# Patient Record
Sex: Female | Born: 1955 | Race: White | Hispanic: No | Marital: Married | Smoking: Current every day smoker
Health system: Southern US, Community
[De-identification: ages and names within clinical notes are randomized; demographics above are authoritative.]

---

## 2007-03-20 ENCOUNTER — Ambulatory Visit: Payer: Self-pay | Admitting: Cardiology

## 2015-09-22 ENCOUNTER — Encounter (INDEPENDENT_AMBULATORY_CARE_PROVIDER_SITE_OTHER): Payer: Self-pay | Admitting: *Deleted

## 2017-11-17 ENCOUNTER — Ambulatory Visit: Payer: 59 | Admitting: Internal Medicine

## 2017-11-17 ENCOUNTER — Encounter: Payer: Self-pay | Admitting: Internal Medicine

## 2017-11-17 VITALS — BP 124/70 | HR 77 | Ht 65.0 in | Wt 95.6 lb

## 2017-11-17 DIAGNOSIS — F17210 Nicotine dependence, cigarettes, uncomplicated: Secondary | ICD-10-CM | POA: Diagnosis not present

## 2017-11-17 DIAGNOSIS — J449 Chronic obstructive pulmonary disease, unspecified: Secondary | ICD-10-CM | POA: Insufficient documentation

## 2017-11-17 DIAGNOSIS — J9819 Other pulmonary collapse: Secondary | ICD-10-CM | POA: Diagnosis not present

## 2017-11-17 NOTE — Assessment & Plan Note (Signed)
See CT chest 10/09/17  - FOB 11/29/17 scheduled  Explained the pathophysiology of RML syndrome emphasizing it's usually from something benign but may be related to retained mucus plugs or more seriously from endobronchial dz not seen on CT so best way to sort out is FOB with BAL   Discussed in detail all the  indications, usual  risks and alternatives  relative to the benefits with patient who agrees to proceed with bronchoscopy with BAL    Total time devoted to counseling  > 50 % of initial 60 min office visit:  review case with pt/husband, son,  discussion of options/alternatives/ personally creating written customized instructions  in presence of pt  then going over those specific  Instructions directly with the pt including how to use all of the meds but in particular covering each new medication in detail and the difference between the maintenance= "automatic" meds and the prns using an action plan format for the latter (If this problem/symptom => do that organization reading Left to right).  Please see AVS from this visit for a full list of these instructions which I personally wrote for this pt and  are unique to this visit.

## 2017-11-17 NOTE — Progress Notes (Signed)
Subjective:     Patient ID: Theresa Villa, female   DOB: 1956/02/18,     MRN: [id number]  HPI  15 yowf active smoker with dx discoid lupus /fibromyalgia followed x 30 years at [facility] with onset cough May/ June 2018 productive yellow mucus abx/ prednisone rx by Dr Sherril Croon and xray abn > CT Chest      11/17/2017 1st [facility] office visit/ Nayelie Gionfriddo   Chief Complaint  Patient presents with  . Pulmonary Consult    Referred by Dr. Sherril Croon for eval of pulmonary nodules. Pt c/o occ CP "pretty constant" sinc May 2018. The pain radiates under her Left breast   She also c/o DOE with walking short distances when she gets in a hurry. She has a prod cough with clear sputum.    cough x May/ June 2018 then cp under L breast cp dull pain not pleuritic at all / present 24/7  Clear is worse first thing in am x up to hour but mucus is mostly clear now Doe = MMRC3 = can't walk 100 yards even at a slow pace at a flat grade s stopping due to sob but still able to do HT slowly / very little housework, uses hc parking Some better p saba though hfa only about 50% effective at baseline and only using symb 160 1 bid but not first thing in am (uses  saba first)    No obvious day to day or daytime variability or assoc excess/ purulent sputum or mucus plugs or hemoptysis or  chest tightness, subjective wheeze or overt sinus or hb symptoms. No unusual exposure hx or h/o childhood pna/ asthma or knowledge of premature birth.  Sleeping ok flat without nocturnal  or early am exacerbation  of respiratory  c/o's or need for noct saba. Also denies any obvious fluctuation of symptoms with weather or environmental changes or other aggravating or alleviating factors except as outlined above   Current Allergies, Complete Past Medical History, Past Surgical History, Family History, and Social History were reviewed in [employer] record.  ROS  The following are not active complaints unless bolded Hoarseness, sore  throat, dysphagia, dental problems, itching, sneezing,  nasal congestion or discharge of excess mucus or purulent secretions, ear ache,   fever, chills, sweats, unintended wt loss or wt gain, classically pleuritic or exertional cp,  orthopnea pnd or leg swelling, presyncope, palpitations, abdominal pain, anorexia, nausea, vomiting, diarrhea  or change in bowel habits or change in bladder habits, change in stools or change in urine, dysuria, hematuria,  rash, arthralgias, visual complaints, headache, numbness, weakness or ataxia or problems with walking or coordination,  change in mood/affect or memory.        Current Meds  Medication Sig  . albuterol (PROAIR HFA) 108 (90 Base) MCG/ACT inhaler Inhale 1 puff into the lungs every 6 (six) hours as needed for wheezing or shortness of breath.  . ALPRAZolam (XANAX) 0.5 MG tablet Take 0.5 mg by mouth daily.  . Ascorbic Acid (VITAMIN C) 1000 MG tablet Take 1,000 mg by mouth daily.  Marland Kitchen atenolol (TENORMIN) 25 MG tablet Take 25 mg by mouth daily.  . budesonide-formoterol (SYMBICORT) 160-4.5 MCG/ACT inhaler Inhale 1 puff into the lungs 2 (two) times daily.  . Clobetasol Propionate (CORMAX EX) Apply topically.  Marland Kitchen denosumab (PROLIA) 60 MG/ML SOLN injection Inject 60 mg into the skin every 6 (six) months. Administer in upper arm, thigh, or abdomen  . ELDERBERRY PO Take by mouth daily.  Marland Kitchen  halobetasol (ULTRAVATE) 0.05 % cream Apply topically as needed.  . hydroxychloroquine (PLAQUENIL) 200 MG tablet Take 200 mg by mouth daily.  . Multiple Vitamins-Minerals (MULTIVITAMIN WITH MINERALS) tablet Take 1 tablet by mouth daily.  Marland Kitchen. tiZANidine (ZANAFLEX) 4 MG capsule Take 8 mg by mouth at bedtime.  . traMADol (ULTRAM) 50 MG tablet Take by mouth every 6 (six) hours as needed.          Review of Systems     Objective:   Physical Exam  amb anxious wf nad  Wt Readings from Last 3 Encounters:  11/17/17 95 lb 9.6 oz (43.4 kg)     Vital signs reviewed - Note on  arrival 02 sats  95% on RA       HEENT: nl dentition, turbinates bilaterally, and oropharynx. Nl external ear canals without cough reflex   NECK :  without JVD/Nodes/TM/ nl carotid upstrokes bilaterally   LUNGS: no acc muscle use,  Nl contour chest  with mid exp wheeze bilaterally but not cough on insp/exp    CV:  RRR  no s3 or murmur or increase in P2, and no edema   ABD:  soft and nontender with nl inspiratory excursion in the supine position. No bruits or organomegaly appreciated, bowel sounds nl  MS:  Nl gait/ ext warm without deformities, calf tenderness, cyanosis or clubbing No obvious joint restrictions   SKIN: warm and dry without lesions    NEURO:  alert, approp, nl sensorium with  no motor or cerebellar deficits apparent.     I personally reviewed images and agree with radiology impression as follows:   CT chest 10/09/17   MPN's all less than 6 mm/ RML atx s mass         Assessment:

## 2017-11-17 NOTE — Assessment & Plan Note (Signed)
>   3 min discussion  I emphasized that although we never turn away smokers from the pulmonary clinic, we do ask that they understand that the recommendations that we make  won't work nearly as well in the presence of continued cigarette exposure and there is no antedote to offer   In fact, we may very well  reach a point where we can't promise to help the patient if he/she can't quit smoking. (We can and will promise to try to help, we just can't promise what we recommend will really work)

## 2017-11-17 NOTE — Assessment & Plan Note (Signed)
Spirometry 11/17/2017  FEV1 1.01 (39%)  Ratio 44 p saba and symb though hfa poor @ baseline - 11/17/2017  After extensive coaching inhaler device  effectiveness =    75% (very short Ti) > rec increase symb 160 to 2 bid      I reviewed the Fletcher curve with the patient that basically indicates  if you quit smoking when your best day FEV1 is still preserved (as is  Only relateively true here) it is unlikely you will progress to endstage  disease and informed the patient there was  no medication on the market that has proven to alter the curve/ its downward trajectory  or the likelihood of progression of their disease(unlike other chronic medical conditions such as atheroclerosis where we do think we can change the natural hx with risk reducing meds)    Therefore stopping smoking and maintaining abstinence are  the most important aspects of care, not choice of inhalers or for that matter, doctors.   Treatment other than smoking cessation  is entirely directed by severity of symptoms and focused also on reducing exacerbations, not attempting to change the natural history of the disease.

## 2017-11-17 NOTE — Patient Instructions (Addendum)
Come to outpatient registration at [facility] (behind the ER) at 715 am Wednesday Jan 16th with nothing to eat or drink after midnight Turesday for Bronchoscopy   symbicort 160 Take 2 puffs first thing in am and then another 2 puffs about 12 hours later.   Only use your albuterol as a rescue medication to be used if you can't catch your breath by resting or doing a relaxed purse lip breathing pattern.  - The less you use it, the better it will work when you need it. - Ok to use up to 2 puffs  every 4 hours if you must but call for immediate appointment if use goes up over your usual need - Don't leave home without it !!  (think of it like the spare tire for your car)    Work on inhaler technique:  relax and gently blow all the way out then take a nice smooth deep breath back in, triggering the inhaler at same time you start breathing in.  Hold for up to 5 seconds if you can. Blow out thru nose. Rinse and gargle with water when done

## 2017-11-22 ENCOUNTER — Telehealth: Payer: Self-pay | Admitting: Internal Medicine

## 2017-11-22 NOTE — Telephone Encounter (Signed)
Called and spoke with pt.  Pt states she had Prolia injection on Monday. Pt states she developed swollen throat and headache.  Pt states these symptoms did subsided with benadryl. Pt is wanting to know if it is okay to have scheduled bronch on 11/29/17 after having Prolia injection.    MW please advise. Thanks

## 2017-11-22 NOTE — Telephone Encounter (Signed)
Spoke with patient. She is aware of MWs recs. Nothing else needed at time of call.  

## 2017-11-22 NOTE — Telephone Encounter (Signed)
Yes, no problem - glad she's feeling better

## 2017-11-27 ENCOUNTER — Telehealth: Payer: Self-pay | Admitting: Internal Medicine

## 2017-11-27 NOTE — Telephone Encounter (Signed)
MW, pt cancelled Bronchoscopy due to illness on 11/29/2017. I called pt to get more details. She states she has sinus issues, coughing, congestion, and headache. She does plan to reschedule this.    3. Nyoka CowdenWert, Michael B, MD (Physician) at 11/17/2017 3:36 PM - Signed    Come to outpatient registration at [facility] (behind the ER) at 715 am Wednesday Jan 16th with nothing to eat or drink after midnight Turesday for Bronchoscopy   symbicort 160 Take 2 puffs first thing in am and then another 2 puffs about 12 hours later.   Only use your albuterol as a rescue medication to be used if you can't catch your breath by resting or doing a relaxed purse lip breathing pattern.  - The less you use it, the better it will work when you need it. - Ok to use up to 2 puffs  every 4 hours if you must but call for immediate appointment if use goes up over your usual need - Don't leave home without it !!  (think of it like the spare tire for your car)    Work on inhaler technique:  relax and gently blow all the way out then take a nice smooth deep breath back in, triggering the inhaler at same time you start breathing in.  Hold for up to 5 seconds if you can. Blow out thru nose. Rinse and gargle with water when done

## 2017-11-27 NOTE — Telephone Encounter (Signed)
Spoke with pt, aware of recs.  [facility] with [facility] to make aware.  Nothing further needed.

## 2017-11-27 NOTE — Telephone Encounter (Signed)
That's fine with me - let her know if not better in a week or so we should see her here as may have something to do with the problem we've been seeing on cxr   Also notify bronch lab at [phone]

## 2017-11-29 ENCOUNTER — Encounter (HOSPITAL_COMMUNITY): Payer: 59

## 2017-11-29 ENCOUNTER — Ambulatory Visit (HOSPITAL_COMMUNITY): Admission: RE | Admit: 2017-11-29 | Payer: 59 | Source: Ambulatory Visit | Admitting: Internal Medicine

## 2017-11-29 ENCOUNTER — Encounter (HOSPITAL_COMMUNITY): Admission: RE | Payer: Self-pay | Source: Ambulatory Visit

## 2017-11-29 SURGERY — VIDEO BRONCHOSCOPY WITHOUT FLUORO
Anesthesia: Moderate Sedation | Laterality: Bilateral

## 2018-07-04 ENCOUNTER — Encounter (INDEPENDENT_AMBULATORY_CARE_PROVIDER_SITE_OTHER): Payer: Self-pay | Admitting: *Deleted

## 2020-05-12 ENCOUNTER — Other Ambulatory Visit: Payer: Self-pay

## 2020-05-12 ENCOUNTER — Encounter: Payer: Self-pay | Admitting: Internal Medicine

## 2020-05-12 ENCOUNTER — Ambulatory Visit (INDEPENDENT_AMBULATORY_CARE_PROVIDER_SITE_OTHER): Payer: Medicare Other | Admitting: Internal Medicine

## 2020-05-12 ENCOUNTER — Other Ambulatory Visit (HOSPITAL_COMMUNITY)
Admission: RE | Admit: 2020-05-12 | Discharge: 2020-05-12 | Disposition: A | Discharge status: Home / Self Care | Payer: Medicare Other | Source: Ambulatory Visit | Attending: Internal Medicine | Admitting: Internal Medicine

## 2020-05-12 ENCOUNTER — Ambulatory Visit (HOSPITAL_COMMUNITY)
Admission: RE | Admit: 2020-05-12 | Discharge: 2020-05-12 | Disposition: A | Discharge status: Home / Self Care | Payer: Medicare Other | Source: Ambulatory Visit | Attending: Internal Medicine | Admitting: Internal Medicine

## 2020-05-12 DIAGNOSIS — J449 Chronic obstructive pulmonary disease, unspecified: Secondary | ICD-10-CM | POA: Diagnosis not present

## 2020-05-12 DIAGNOSIS — J984 Other disorders of lung: Secondary | ICD-10-CM

## 2020-05-12 DIAGNOSIS — F17210 Nicotine dependence, cigarettes, uncomplicated: Secondary | ICD-10-CM | POA: Diagnosis not present

## 2020-05-12 LAB — CBC WITH DIFFERENTIAL/PLATELET
Abs Immature Granulocytes: 0.04 10*3/uL (ref 0.00–0.07)
Basophils Absolute: 0.1 10*3/uL (ref 0.0–0.1)
Basophils Relative: 1 %
Eosinophils Absolute: 0.2 10*3/uL (ref 0.0–0.5)
Eosinophils Relative: 2 %
HCT: 43.1 % (ref 36.0–46.0)
Hemoglobin: 13.4 g/dL (ref 12.0–15.0)
Immature Granulocytes: 0 %
Lymphocytes Relative: 13 %
Lymphs Abs: 1.5 10*3/uL (ref 0.7–4.0)
MCH: 27.3 pg (ref 26.0–34.0)
MCHC: 31.1 g/dL (ref 30.0–36.0)
MCV: 88 fL (ref 80.0–100.0)
Monocytes Absolute: 0.6 10*3/uL (ref 0.1–1.0)
Monocytes Relative: 5 %
Neutro Abs: 9.1 10*3/uL — ABNORMAL HIGH (ref 1.7–7.7)
Neutrophils Relative %: 79 %
Platelets: 425 10*3/uL — ABNORMAL HIGH (ref 150–400)
RBC: 4.9 MIL/uL (ref 3.87–5.11)
RDW: 16.2 % — ABNORMAL HIGH (ref 11.5–15.5)
WBC: 11.4 10*3/uL — ABNORMAL HIGH (ref 4.0–10.5)
nRBC: 0.2 % (ref 0.0–0.2)

## 2020-05-12 LAB — SEDIMENTATION RATE: Sed Rate: 45 mm/hr — ABNORMAL HIGH (ref 0–22)

## 2020-05-12 MED ORDER — AMOXICILLIN-POT CLAVULANATE 875-125 MG PO TABS
1.0000 | ORAL_TABLET | Freq: Two times a day (BID) | ORAL | 0 refills | Status: AC
Start: 2020-05-12 — End: 2020-05-22

## 2020-05-12 NOTE — Assessment & Plan Note (Signed)
Counseled re importance of smoking cessation but did not meet time criteria for separate billing     Medical decision making was a moderate level of complexity in this case because of  two chronic conditions /diagnoses and one new problem  requiring extra time for  H and P, chart review, counseling,  and generating customized AVS unique to this office visit and charting.   Each maintenance medication was reviewed in detail including emphasizing most importantly the difference between maintenance and prns and under what circumstances the prns are to be triggered using an action plan format where appropriate. Please see avs for details which were reviewed in writing by both me and my nurse and patient given a written copy highlighted where appropriate with yellow highlighter for the patient's continued care at home along with an updated version of their medications.  Patient was asked to maintain medication reconciliation by comparing this list to the actual medications being used at home and to contact this office right away if there is a conflict or discrepancy.

## 2020-05-12 NOTE — Patient Instructions (Addendum)
Plan A = Automatic = Always=    symbicort 160 Take 2 puffs first thing in am and then another 2 puffs about 12 hours later.   Work on inhaler technique:  relax and gently blow all the way out then take a nice smooth deep breath back in, triggering the inhaler at same time you start breathing in.  Hold for up to 5 seconds if you can. Blow out thru nose. Rinse and gargle with water when done    Plan B = Backup (to supplement plan A, not to replace it) Only use your albuterol inhaler as a rescue medication to be used if you can't catch your breath by resting or doing a relaxed purse lip breathing pattern.  - The less you use it, the better it will work when you need it. - Ok to use the inhaler up to 2 puffs  every 4 hours if you must but call for appointment if use goes up over your usual need - Don't leave home without it !!  (think of it like the spare tire for your car)   Plan C = Crisis (instead of Plan B but only if Plan B stops working) - only use your albuterol nebulizer if you first try Plan B and it fails to help > ok to use the nebulizer up to every 4 hours but if start needing it regularly call for immediate appointment  Augmentin 875 mg take one pill twice daily  X 10 days - take at breakfast and supper with large glass of water.  It would help reduce the usual side effects (diarrhea and yeast infections) if you ate cultured yogurt at lunch or a probiotic.  Please remember to go to the lab and x-ray department   for your tests - we will call you with the results when they are available.     Please schedule a follow up office visit in 2  weeks, sooner if needed

## 2020-05-12 NOTE — Assessment & Plan Note (Addendum)
New dx 04/18/20 post segment   - 05/12/2020   Augmentin 875 mg take one pill twice daily  X 10 days  - Quant Gold TB 05/12/2020  - Blasto/ histo/ crypto serologies 05/12/2020   ddx is lung abscess vs lung ca vs necrotizing cancer immitating lung abscess and I favor the latter esp with localized rhonchi on the L but she really hasn't been treated yet for this type of lung infection and is a very poor candidate at this point for any intervention or rx so will rx as infection x 10 day then bring back in 2 weeks and consider FOB at that point depending on progress  Discussed in detail all the  indications, usual  risks and alternatives  relative to the benefits with patient who agrees to proceed with Rx as outlined.

## 2020-05-12 NOTE — Assessment & Plan Note (Signed)
Active smoker Spirometry 11/17/2017  FEV1 1.01 (39%)  Ratio 44 p saba and symb though hfa poor @ baseline - 11/17/2017  After extensive coaching inhaler device  effectiveness =    75% (very short Ti) > rec increase symb 160 to 2 bid    AB on symbicort 160 2bid and prn saba   I spent extra time with pt today reviewing appropriate use of albuterol for prn use on exertion with the following points: 1) saba is for relief of sob that does not improve by walking a slower pace or resting but rather if the pt does not improve after trying this first. 2) If the pt is convinced, as many are, that saba helps recover from activity faster then it's easy to tell if this is the case by re-challenging : ie stop, take the inhaler, then p 5 minutes try the exact same activity (intensity of workload) that just caused the symptoms and see if they are substantially diminished or not after saba 3) if there is an activity that reproducibly causes the symptoms, try the saba 15 min before the activity on alternate days   If in fact the saba really does help, then fine to continue to use it prn but advised may need to look closer at the maintenance regimen being used to achieve better control of airways disease with exertion.

## 2020-05-12 NOTE — Progress Notes (Signed)
Subjective:     Patient ID: Theresa Villa, female   DOB: 08-30-1956,     MRN: [id number]    Brief patient profile:  3 yowf active smoker with dx discoid lupus /fibromyalgia followed x 30 years at [facility] with onset cough May/ June 2018 productive yellow mucus abx/ prednisone rx by Dr Theresa Villa and xray abn > CT Chest      11/17/2017 1st Theresa Villa/ Theresa Villa   Chief Complaint  Patient presents with  . Pulmonary Consult    Referred by Dr. Sherril Villa for eval of pulmonary nodules. Pt c/o occ CP "pretty constant" sinc May 2018. The pain radiates under her Left breast   She also c/o DOE with walking short distances when she gets in a hurry. She has a prod cough with clear sputum.    cough x May/ June 2018 then cp under L breast cp dull pain not pleuritic at all / present 24/7  Clear is worse first thing in am x up to hour but mucus is mostly clear now Doe = MMRC3 = can't walk 100 yards even at a slow pace at a flat grade s stopping due to sob but still able to do HT slowly / very little housework, uses hc parking Some better p saba though hfa only about 50% effective at baseline and only using symb 160 1 bid but not first thing in am (uses  saba first)  rec Symbicort 160 Take 2 puffs first thing in am and then another 2 puffs about 12 hours later.  Only use your albuterol as a rescue medication  Work on inhaler technique RML syndrome > rec fob / pt canceled    05/12/2020  Pulmonary consultation  Re  Lung mass/ prednisone 5 mg one  Chief Complaint  Patient presents with  . Follow-up    Last seen in 2019 and referred back Dr Dr Theresa Villa for eval of abnormal cxr and ct chest.   w/in 24 h of moderna March 21st 2021  But  not treated until end of May = 04/08/20 with  5 days of zmax  and prednisone x 7 days then continued on prednisone daily  Dyspnea:  Walking slow pace anywhere she wants to go  Cough: yellow mucus  Sleeping: wakes up coughing nightly  SABA use: symbicort 2 bid/ albuterol 02:  none    No obvious day to day or daytime variability or assoc excess/ purulent sputum or mucus plugs or hemoptysis or cp or chest tightness, subjective wheeze or overt sinus or hb symptoms.     Also denies any obvious fluctuation of symptoms with weather or environmental changes or other aggravating or alleviating factors except as outlined above   No unusual exposure hx or h/o childhood pna/ asthma or knowledge of premature birth.  Current Allergies, Complete Past Medical History, Past Surgical History, Family History, and Social History were reviewed in [employer] record.  ROS  The following are not active complaints unless bolded Hoarseness, sore throat, dysphagia, dental problems, itching, sneezing,  nasal congestion or discharge of excess mucus or purulent secretions, ear ache,   fever, chills, sweats, unintended wt loss or wt gain, classically pleuritic or exertional cp,  orthopnea pnd or arm/hand swelling  or leg swelling, presyncope, palpitations, abdominal pain, anorexia, nausea, vomiting, diarrhea  or change in bowel habits or change in bladder habits, change in stools or change in urine, dysuria, hematuria,  rash, arthralgias, visual complaints, headache, numbness, weakness or ataxia or problems  with walking or coordination,  change in mood or  memory.        Current Meds  Medication Sig  . albuterol (PROAIR HFA) 108 (90 Base) MCG/ACT inhaler Inhale 1 puff into the lungs every 6 (six) hours as needed for wheezing or shortness of breath.  . ALPRAZolam (XANAX) 0.25 MG tablet Take 0.25 mg by mouth daily.   Marland Kitchen atenolol (TENORMIN) 25 MG tablet Take 25 mg by mouth daily.  Marland Kitchen atorvastatin (LIPITOR) 40 MG tablet Take 40 mg by mouth daily.  . budesonide-formoterol (SYMBICORT) 160-4.5 MCG/ACT inhaler Inhale 1 puff into the lungs 2 (two) times daily.  . clobetasol (TEMOVATE) 0.05 % external solution Apply 1 application topically daily as needed (for lupus).  Marland Kitchen denosumab  (PROLIA) 60 MG/ML SOLN injection Inject 60 mg into the skin every 6 (six) months. Administer in upper arm, thigh, or abdomen  . ELDERBERRY PO Take 1 capsule by mouth daily.   . halobetasol (ULTRAVATE) 0.05 % cream Apply 1 application topically 2 (two) times daily as needed (for lupus).   . hydroxychloroquine (PLAQUENIL) 200 MG tablet Take 200 mg by mouth daily.  Marland Kitchen ibuprofen (ADVIL,MOTRIN) 200 MG tablet Take 200 mg by mouth every 6 (six) hours as needed for headache or moderate pain.  . Multiple Vitamins-Minerals (MULTIVITAMIN WITH MINERALS) tablet Take 1 tablet by mouth daily.  . predniSONE (DELTASONE) 5 MG tablet Take 5 mg by mouth daily with breakfast.  . tiZANidine (ZANAFLEX) 4 MG capsule Take 8 mg by mouth at bedtime.  . traMADol (ULTRAM) 50 MG tablet Take 50 mg by mouth 3 (three) times daily.   . vitamin C (ASCORBIC ACID) 500 MG tablet Take 500 mg by mouth daily.                   Objective:   Physical Exam  amb thin wf appears chronically ill    Wt Readings from Last 3 Encounters:  05/12/20 92 lb (41.7 kg)  11/17/17 95 lb 9.6 oz (43.4 kg)     Vital signs reviewed - Note on arrival 05/12/2020  02 sats  94% on RA       Top dentures/ lower teeth  HEENT : pt wearing mask not removed for exam due to covid -19 concerns.    NECK :  without JVD/Nodes/TM/ nl carotid upstrokes bilaterally   LUNGS: no acc muscle use,  Mod barrel  contour chest wall with bilateral  Distant bs with localized rhonchi LUL  and  without cough on insp or exp maneuvers and mod  Hyperresonant  to  percussion bilaterally     CV:  RRR  no s3 or murmur or increase in P2, and no edema   ABD:  soft and nontender with pos mid insp Hoover's  in the supine position. No bruits or organomegaly appreciated, bowel sounds nl  MS:     ext warm without deformities, calf tenderness, cyanosis or clubbing No obvious joint restrictions   SKIN: warm and dry without lesions    NEURO:  alert, approp, nl sensorium with   no motor or cerebellar deficits apparent.         CXR PA and Lateral:   05/12/2020 :    I personally reviewed images and agree with radiology impression as follows:   No change in a cavitary mass like opacity in the left upper lobe worrisome for carcinoma.  Aortic Atherosclerosis (ICD10-I70.0) and Emphysema (ICD10-J43.9).     Lab Results  Component Value Date  WBC 11.4 (H) 05/12/2020   HGB 13.4 05/12/2020   HCT 43.1 05/12/2020   MCV 88.0 05/12/2020   PLT 425 (H) 05/12/2020       EOS                                                                0.2                                    05/12/2020      Lab Results  Component Value Date   ESRSEDRATE 45 (H) 05/12/2020    Labs ordered 05/12/2020  :  [facility]ters TB/ crypto/histo/blasto serologies    Assessment:

## 2020-05-13 ENCOUNTER — Other Ambulatory Visit: Payer: Self-pay | Admitting: Internal Medicine

## 2020-05-13 DIAGNOSIS — J984 Other disorders of lung: Secondary | ICD-10-CM

## 2020-05-13 LAB — HISTONE ANTIBODIES, IGG, BLOOD: DNA-Histone: 0.9 Units (ref 0.0–0.9)

## 2020-05-13 NOTE — Progress Notes (Signed)
Spoke with pt and notified of results per Dr. Sherene Sires. Pt verbalized understanding and denied any questions. Order for future cxr placed.

## 2020-05-14 LAB — QUANTIFERON-TB GOLD PLUS (RQFGPL)
QuantiFERON Mitogen Value: 1.61 IU/mL
QuantiFERON Nil Value: 0.1 IU/mL
QuantiFERON TB1 Ag Value: 0.09 IU/mL
QuantiFERON TB2 Ag Value: 0.1 IU/mL

## 2020-05-14 LAB — QUANTIFERON-TB GOLD PLUS: QuantiFERON-TB Gold Plus: NEGATIVE

## 2020-05-15 LAB — BLASTOMYCES ANTIGEN: Blastomyces Antigen: NOT DETECTED ng/mL

## 2020-05-25 ENCOUNTER — Telehealth: Payer: Self-pay | Admitting: Internal Medicine

## 2020-05-25 NOTE — Telephone Encounter (Signed)
Per pt's last cxr, MW wants pt to have a repeat cxr at next OV. Called and spoke with pt letting her know this info and she verbalized understanding. Nothing further needed.

## 2020-05-25 NOTE — Telephone Encounter (Signed)
There isn't an order for CT.  Will route to triage to see if order needs to be placed.

## 2020-06-03 ENCOUNTER — Other Ambulatory Visit: Payer: Self-pay

## 2020-06-03 ENCOUNTER — Encounter: Payer: Self-pay | Admitting: Internal Medicine

## 2020-06-03 ENCOUNTER — Ambulatory Visit (INDEPENDENT_AMBULATORY_CARE_PROVIDER_SITE_OTHER): Payer: Medicare Other | Admitting: Internal Medicine

## 2020-06-03 ENCOUNTER — Ambulatory Visit (HOSPITAL_COMMUNITY)
Admission: RE | Admit: 2020-06-03 | Discharge: 2020-06-03 | Disposition: A | Discharge status: Home / Self Care | Payer: Medicare Other | Source: Ambulatory Visit | Attending: Internal Medicine | Admitting: Internal Medicine

## 2020-06-03 DIAGNOSIS — J984 Other disorders of lung: Secondary | ICD-10-CM

## 2020-06-03 DIAGNOSIS — F17210 Nicotine dependence, cigarettes, uncomplicated: Secondary | ICD-10-CM

## 2020-06-03 DIAGNOSIS — J449 Chronic obstructive pulmonary disease, unspecified: Secondary | ICD-10-CM | POA: Diagnosis not present

## 2020-06-03 NOTE — Patient Instructions (Addendum)
The key is to stop smoking completely before smoking completely stops you!  Please remember to go to the  x-ray department at [facility]  for your tests - we will call you with the results when they are available    Please schedule a follow up visit in 3 months but call sooner if needed  > change to f/u cxr in 6 weeks

## 2020-06-03 NOTE — Progress Notes (Signed)
Subjective:     Patient ID: Theresa Villa, female   DOB: Apr 13, 1956,     MRN: [id number]    Brief patient profile:  89 yowf active smoker with dx discoid lupus /fibromyalgia followed x 30 years at [facility] with onset cough May/ June 2018 productive yellow mucus abx/ prednisone rx by Dr Sherril Croon and xray abn > CT Chest      11/17/2017 1st [facility] office visit/ Nori Poland   Chief Complaint  Patient presents with   Pulmonary Consult    Referred by Dr. Sherril Croon for eval of pulmonary nodules. Pt c/o occ CP "pretty constant" sinc May 2018. The pain radiates under her Left breast   She also c/o DOE with walking short distances when she gets in a hurry. She has a prod cough with clear sputum.    cough x May/ June 2018 then cp under L breast cp dull pain not pleuritic at all / present 24/7  Clear is worse first thing in am x up to hour but mucus is mostly clear now Doe = MMRC3 = can't walk 100 yards even at a slow pace at a flat grade s stopping due to sob but still able to do HT slowly / very little housework, uses hc parking Some better p saba though hfa only about 50% effective at baseline and only using symb 160 1 bid but not first thing in am (uses  saba first)  rec Symbicort 160 Take 2 puffs first thing in am and then another 2 puffs about 12 hours later.  Only use your albuterol as a rescue medication  Work on inhaler technique RML syndrome > rec fob / pt canceled    05/12/2020  Pulmonary consultation  Re  Lung mass/ prednisone 5 mg one  Chief Complaint  Patient presents with   Follow-up    Last seen in 2019 and referred back Dr Dr Lissa Hoard for eval of abnormal cxr and ct chest.   w/in 24 h of moderna March 21st 2021  But  not treated until end of May = 04/08/20 with  5 days of zmax  and prednisone x 7 days then continued on prednisone daily  Dyspnea:  Walking slow pace anywhere she wants to go  Cough: yellow mucus  Sleeping: wakes up coughing nightly  SABA use: symbicort 2 bid/ albuterol 02:  none  rec  Plan A = Automatic = Always=    symbicort 160 Take 2 puffs first thing in am and then another 2 puffs about 12 hours later.  Work on inhaler technique:  Plan B = Backup (to supplement plan A, not to replace it) Only use your albuterol inhaler as a rescue medication  Plan C = Crisis (instead of Plan B but only if Plan B stops working) - only use your albuterol nebulizer if you first try Plan B and it fails to help > ok to use the nebulizer up to every 4 hours   Augmentin 875 mg take one pill twice daily  X 10 days - take at breakfast and supper with large glass of water.  Please remember to go to the lab and x-ray department   for your tests - we will call you with the results when they are available   06/03/2020  f/u ov/[place] office/Macoy Rodwell re: ? Lung abscess  Chief Complaint  Patient presents with   Follow-up    Patient states that she feels much better since last visit. Productive cough with clear/yellow mucus. Augmentin helped a lot.  Dyspnea:  Not limited by breathing from desired activities   Cough: green yellow > white  Sleeping: no noct cough  SABA use: still using couple times a day  on symb 160 2bid / never neb  02: none    No obvious day to day or daytime variability or assoc excess/ purulent sputum or mucus plugs or hemoptysis or cp or chest tightness, subjective wheeze or overt sinus or hb symptoms.   Sleeping without nocturnal  or early am exacerbation  of respiratory  c/o's or need for noct saba. Also denies any obvious fluctuation of symptoms with weather or environmental changes or other aggravating or alleviating factors except as outlined above   No unusual exposure hx or h/o childhood pna/ asthma or knowledge of premature birth.  Current Allergies, Complete Past Medical History, Past Surgical History, Family History, and Social History were reviewed in [employer] record.  ROS  The following are not active complaints unless  bolded Hoarseness, sore throat, dysphagia, dental problems, itching, sneezing,  nasal congestion or discharge of excess mucus or purulent secretions, ear ache,   fever, chills, sweats, unintended wt loss or wt gain, classically pleuritic or exertional cp,  orthopnea pnd or arm/hand swelling  or leg swelling, presyncope, palpitations, abdominal pain, anorexia, nausea, vomiting, diarrhea  or change in bowel habits or change in bladder habits, change in stools or change in urine, dysuria, hematuria,  rash, arthralgias, visual complaints, headache, numbness, weakness or ataxia or problems with walking or coordination,  change in mood or  memory.        Current Meds  Medication Sig   albuterol (PROAIR HFA) 108 (90 Base) MCG/ACT inhaler Inhale 1 puff into the lungs every 6 (six) hours as needed for wheezing or shortness of breath.   ALPRAZolam (XANAX) 0.25 MG tablet Take 0.25 mg by mouth daily.    atenolol (TENORMIN) 25 MG tablet Take 25 mg by mouth daily.   atorvastatin (LIPITOR) 40 MG tablet Take 40 mg by mouth daily.   budesonide-formoterol (SYMBICORT) 160-4.5 MCG/ACT inhaler Inhale 1 puff into the lungs 2 (two) times daily.   clobetasol (TEMOVATE) 0.05 % external solution Apply 1 application topically daily as needed (for lupus).   denosumab (PROLIA) 60 MG/ML SOLN injection Inject 60 mg into the skin every 6 (six) months. Administer in upper arm, thigh, or abdomen   ELDERBERRY PO Take 1 capsule by mouth daily.    halobetasol (ULTRAVATE) 0.05 % cream Apply 1 application topically 2 (two) times daily as needed (for lupus).    hydroxychloroquine (PLAQUENIL) 200 MG tablet Take 200 mg by mouth daily.   ibuprofen (ADVIL,MOTRIN) 200 MG tablet Take 200 mg by mouth every 6 (six) hours as needed for headache or moderate pain.   Multiple Vitamins-Minerals (MULTIVITAMIN WITH MINERALS) tablet Take 1 tablet by mouth daily.   predniSONE (DELTASONE) 5 MG tablet Take 5 mg by mouth 2 (two) times daily  with a meal.    tiZANidine (ZANAFLEX) 4 MG capsule Take 8 mg by mouth at bedtime.   traMADol (ULTRAM) 50 MG tablet Take 50 mg by mouth 3 (three) times daily.    vitamin C (ASCORBIC ACID) 500 MG tablet Take 500 mg by mouth daily.                  Objective:   Physical Exam  amb cheerful wf nad    06/03/2020       93   05/12/20 92 lb (41.7 kg)  11/17/17  95 lb 9.6 oz (43.4 kg)    Vital signs reviewed  06/03/2020  - Note at rest 02 sats  94% on RA       Top dentures/ lower teeth   HEENT : pt wearing mask not removed for exam due to covid - 19 concerns.    NECK :  without JVD/Nodes/TM/ nl carotid upstrokes bilaterally   LUNGS: no acc muscle use,  Mild barrel  contour chest wall with bilateral  Distant bs s audible wheeze and  without cough on insp or exp maneuvers  and mild  Hyperresonant  to  percussion bilaterally     CV:  RRR  no s3 or murmur or increase in P2, and no edema   ABD:  soft and nontender with pos end  insp Hoover's  in the supine position. No bruits or organomegaly appreciated, bowel sounds nl  MS:   Nl gait/  ext warm without deformities, calf tenderness, cyanosis or clubbing No obvious joint restrictions   SKIN: warm and dry without lesions    NEURO:  alert, approp, nl sensorium with  no motor or cerebellar deficits apparent.                CXR PA and Lateral:   06/03/2020 :    I personally reviewed images and   impression as follows:    improved LUL cavitary mass on both pa and lateral views      Assessment:

## 2020-06-04 ENCOUNTER — Encounter: Payer: Self-pay | Admitting: Internal Medicine

## 2020-06-04 NOTE — Assessment & Plan Note (Addendum)
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   Discussed in detail all the  indications, usual  risks and alternatives  relative to the benefits with patient who agrees to proceed with conservative f/u as outlined               Each maintenance medication was reviewed in detail including emphasizing most importantly the difference between maintenance and prns and under what circumstances the prns are to be triggered using an action plan format where appropriate.  Total time for H and P, chart review, counseling,   and generating customized AVS unique to this office visit / charting = 20 min

## 2020-06-04 NOTE — Assessment & Plan Note (Signed)
3  min discussion re active cigarette smoking in addition to office E&M  Ask about tobacco use:   Ongoing as is her husband Advise quitting   I took an extended  opportunity with this patient to outline the consequences of continued cigarette use  in airway disorders based on all the data we have from the multiple national lung health studies (perfomed over decades at millions of dollars in cost)  indicating that smoking cessation, not choice of inhalers or physicians, is the most important aspect of her care.   Assess willingness:  Not committed at this point Assist in quit attempt:  Per PCP when ready Arrange follow up:   Follow up per Primary Care planned

## 2020-06-04 NOTE — Assessment & Plan Note (Addendum)
Active smoker Spirometry 11/17/2017  FEV1 1.01 (39%)  Ratio 44 p saba and symb though hfa poor @ baseline - 11/17/2017  After extensive coaching inhaler device  effectiveness =    75% (very short Ti) > rec increase symb 160 to 2 bid    Improved on symb 160 2bid and prednisone which is being tapered by rheum /[facility] for discoid lupus.  No change in rx for now, but candidate for breztri or even bevespi if streroid component superfluous (if she end up staying on systemic steroids anyway) as she may be setting herself up for recurrent pulmonary infections on high dose ics and any amt of chronic systemic doses.

## 2020-06-08 ENCOUNTER — Other Ambulatory Visit: Payer: Self-pay | Admitting: Internal Medicine

## 2020-06-08 DIAGNOSIS — J984 Other disorders of lung: Secondary | ICD-10-CM

## 2020-06-08 NOTE — Progress Notes (Signed)
Spoke with pt and notified of results per Dr. Sherene Sires. Pt verbalized understanding and denied any questions. Order for cxr was placed.

## 2020-06-09 ENCOUNTER — Ambulatory Visit: Payer: Medicare Other | Admitting: Internal Medicine

## 2020-06-22 ENCOUNTER — Telehealth: Payer: Self-pay | Admitting: Internal Medicine

## 2020-06-22 ENCOUNTER — Ambulatory Visit: Payer: Medicare Other | Admitting: Internal Medicine

## 2020-06-22 DIAGNOSIS — J984 Other disorders of lung: Secondary | ICD-10-CM

## 2020-06-22 MED ORDER — AMOXICILLIN-POT CLAVULANATE 875-125 MG PO TABS
1.0000 | ORAL_TABLET | Freq: Two times a day (BID) | ORAL | 0 refills | Status: DC
Start: 2020-06-22 — End: 2020-07-21

## 2020-06-22 NOTE — Telephone Encounter (Signed)
Augmentin 875 mg take one pill twice daily  X 20 days - take at breakfast and supper with large glass of water.  It would help reduce the usual side effects (diarrhea and yeast infections) if you ate cultured yogurt at lunch.   Set up with f/u cxr and appt in [place] in 4 weeks

## 2020-06-22 NOTE — Telephone Encounter (Signed)
Called and spoke with patient she states that her symptoms have not got any better since she finished antibiotic a month ago. States that her mucus is yellow. Dr. Sherene Sires please advise     Patient states still having symptom of cough and mucus. Finished antiobotic over a month ago. Pharmacy is [facility] [place]. Patient phone number is [phone].

## 2020-06-22 NOTE — Telephone Encounter (Signed)
Spoke with the pt and notified of response per Dr Sherene Sires and she verbalized understanding  Rx sent  Appt scheduled

## 2020-07-19 ENCOUNTER — Telehealth: Payer: Self-pay | Admitting: Pulmonary Disease

## 2020-07-19 DIAGNOSIS — R059 Cough, unspecified: Secondary | ICD-10-CM

## 2020-07-19 DIAGNOSIS — J984 Other disorders of lung: Secondary | ICD-10-CM

## 2020-07-19 NOTE — Telephone Encounter (Signed)
Patients spouse called answering service  Saturations running 88-91  Cough on dose feeling poorly  Encouraged to obtain Mucinex, Mucinex DM or Delsym to help manage her coughing  Continue bronchodilator treatments  Patient self as best as possible  As long as saturations above 88, nothing to worry about at present  Patient has an appointment to see Dr. Sherene Sires in a couple of weeks  Call the office if still concerned to see if she can be seen sooner

## 2020-07-21 MED ORDER — CLINDAMYCIN HCL 300 MG PO CAPS: 300.0000 mg | ORAL_CAPSULE | Freq: Three times a day (TID) | ORAL | 0 refills | Status: DC

## 2020-07-21 NOTE — Telephone Encounter (Signed)
Husband calling because pt is not doing better after doing the Augmentin treatment and would like to know what the should do. Offered a in office visit with NP in [place] but pt refused. Husband can reached at [phone]. Pt is MW patient

## 2020-07-21 NOTE — Telephone Encounter (Signed)
I ordered the CT to be done in 10 days

## 2020-07-21 NOTE — Telephone Encounter (Signed)
Called and spoke with pt's husband who stated that pt finished abx 1 week ago and he does not feel like pt is any better. He states that pt is weak, has been coughing getting up clear to yellow phlegm, has a rattle in chest with coughing and also when sleeping, and also has had some increased SOB.  Temp was checked when pt first woke up which was 99.0 but pt had also been wrapped in covers. Temp was rechecked about 5-10 minutes after pt had been awake and was no longer wrapped in covers and that recheck temp was 98.6.  Pt has had to use rescue inhaler more than 3 times daily as stated by pt's husband.  Pt is using symbicort inhaler twice daily as prescribed.  Pt's husband called answering service 9/5 due to pt's symptoms and was told by AO to take either mucinex, mucinex DM, or delsym to help with symptoms and he stated that pt has been doing mucinex twice daily.   Due to pt's symptoms not being much better since finished abx, pt and husband want recommendations.  Dr. Sherene Sires, please advise.  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   Discussed in detail all the  indications, usual  risks and alternatives  relative to the benefits  with patient who agrees to proceed with conservative f/u as outlined               Each maintenance medication was reviewed in detail including emphasizing most importantly the difference between maintenance and prns and under what circumstances the prns are to be triggered using an action plan format where appropriate.  Total time for H and P, chart review, counseling,   and generating customized AVS unique to this office visit / charting = 20 min         Assessment & Plan Note by Nyoka Cowden, MD at 06/04/2020 5:22 AM Author: Nyoka Cowden, MD Author Type: Physician Filed: 06/04/2020 5:23 AM  Note Status: Written Cosign: Cosign Not Required Encounter Date: 06/03/2020  Problem: Cigarette smoker  Editor: Nyoka Cowden, MD (Physician)                 3  min discussion re active cigarette smoking in addition to office E&M  Ask about tobacco use:   Ongoing as is her husband Advise quitting   I took an extended  opportunity with this patient to outline the consequences of continued cigarette use  in airway disorders based on all the data we have from the multiple national lung  health studies (perfomed over decades at millions of dollars in cost)  indicating that smoking cessation, not choice of inhalers or physicians, is the most important aspect of her care.   Assess willingness:  Not committed at this point Assist in quit attempt:  Per PCP when ready Arrange follow up:   Follow up per Primary Care planned          Patient Instructions by Nyoka Cowden, MD at 06/03/2020 3:00 PM Author: Nyoka Cowden, MD Author Type: Physician Filed: 06/04/2020 5:22 AM  Note Status: Addendum Cosign: Cosign Not Required Encounter Date: 06/03/2020  Editor: Nyoka Cowden, MD (Physician)      Prior Versions: 1. Nyoka Cowden, MD (Physician) at 06/03/2020 3:36 PM - Addendum   2. Nyoka Cowden, MD (Physician) at 06/03/2020 3:32 PM - Signed      The key is to stop smoking completely  before smoking completely stops you!  Please remember to go to the  x-ray department at [facility]  for your tests - we will call you with the results when they are available    Please schedule a follow up visit in 3 months but call sooner if needed  > change to f/u cxr in 6 weeks

## 2020-07-21 NOTE — Telephone Encounter (Signed)
Pt's husband confirms she was doing great p 10 d of augement then gradually worse and better again after 20 days of augmentin and then finished 9 days ago and now gradually worse  - advised we can try clindamycin since allergic to cipro and dx is likely abscess but concerned about underlying cancer in this ongoing smoker   rec No more smoking if possible   Clindamycin plus 10 days plus pro biotic - I called in  Repeat CT chest p 10 days of clindamycin  - to be done at [facility]  F/u cavitary mass  Any worse on clinda > ER at [facility]

## 2020-07-27 ENCOUNTER — Other Ambulatory Visit: Payer: Self-pay

## 2020-07-27 ENCOUNTER — Ambulatory Visit (HOSPITAL_COMMUNITY)
Admission: RE | Admit: 2020-07-27 | Discharge: 2020-07-27 | Disposition: A | Discharge status: Home / Self Care | Payer: Medicare Other | Source: Ambulatory Visit | Attending: Internal Medicine | Admitting: Internal Medicine

## 2020-07-27 DIAGNOSIS — J984 Other disorders of lung: Secondary | ICD-10-CM | POA: Diagnosis present

## 2020-07-27 DIAGNOSIS — R05 Cough: Secondary | ICD-10-CM | POA: Diagnosis present

## 2020-07-27 DIAGNOSIS — R059 Cough, unspecified: Secondary | ICD-10-CM

## 2020-07-30 ENCOUNTER — Other Ambulatory Visit: Payer: Self-pay | Admitting: Internal Medicine

## 2020-07-30 ENCOUNTER — Telehealth: Payer: Self-pay | Admitting: Internal Medicine

## 2020-07-30 DIAGNOSIS — J984 Other disorders of lung: Secondary | ICD-10-CM

## 2020-07-30 NOTE — Telephone Encounter (Signed)
Called and spoke with pt in regards to upcoming PET and she verbalized understanding. Restated to pt the appt date and time info and she verbalized understanding. Nothing further needed.

## 2020-07-30 NOTE — Progress Notes (Signed)
Pet ordered

## 2020-08-04 ENCOUNTER — Other Ambulatory Visit: Payer: Self-pay

## 2020-08-04 ENCOUNTER — Ambulatory Visit (HOSPITAL_COMMUNITY)
Admission: RE | Admit: 2020-08-04 | Discharge: 2020-08-04 | Disposition: A | Discharge status: Home / Self Care | Payer: Medicare Other | Source: Ambulatory Visit | Attending: Internal Medicine | Admitting: Internal Medicine

## 2020-08-04 DIAGNOSIS — J984 Other disorders of lung: Secondary | ICD-10-CM | POA: Insufficient documentation

## 2020-08-04 DIAGNOSIS — J479 Bronchiectasis, uncomplicated: Secondary | ICD-10-CM | POA: Insufficient documentation

## 2020-08-04 DIAGNOSIS — I700 Atherosclerosis of aorta: Secondary | ICD-10-CM | POA: Insufficient documentation

## 2020-08-04 DIAGNOSIS — J439 Emphysema, unspecified: Secondary | ICD-10-CM | POA: Insufficient documentation

## 2020-08-04 DIAGNOSIS — N200 Calculus of kidney: Secondary | ICD-10-CM | POA: Insufficient documentation

## 2020-08-04 DIAGNOSIS — Z9049 Acquired absence of other specified parts of digestive tract: Secondary | ICD-10-CM | POA: Diagnosis not present

## 2020-08-04 LAB — GLUCOSE, CAPILLARY: Glucose-Capillary: 168 mg/dL — ABNORMAL HIGH (ref 70–99)

## 2020-08-04 MED ORDER — FLUDEOXYGLUCOSE F - 18 (FDG) INJECTION
5.2000 | Freq: Once | INTRAVENOUS | Status: AC | PRN
  Administered 2020-08-04: 5.2 via INTRAVENOUS

## 2020-08-07 ENCOUNTER — Ambulatory Visit (INDEPENDENT_AMBULATORY_CARE_PROVIDER_SITE_OTHER): Payer: Medicare Other | Admitting: Internal Medicine

## 2020-08-07 ENCOUNTER — Encounter: Payer: Self-pay | Admitting: Internal Medicine

## 2020-08-07 ENCOUNTER — Other Ambulatory Visit: Payer: Self-pay

## 2020-08-07 DIAGNOSIS — J449 Chronic obstructive pulmonary disease, unspecified: Secondary | ICD-10-CM

## 2020-08-07 DIAGNOSIS — J984 Other disorders of lung: Secondary | ICD-10-CM | POA: Diagnosis not present

## 2020-08-07 NOTE — Patient Instructions (Addendum)
Make sure you check your oxygen saturations at highest level of activity to be sure it stays over 90% and adjust upward to maintain this level if needed but remember to turn it back to previous settings when you stop (to conserve your supply).   Only use your albuterol as a rescue medication to be used if you can't catch your breath by resting or doing a relaxed purse lip breathing pattern.  - The less you use it, the better it will work when you need it. - Ok to use up to 2 puffs  every 4 hours if you must but call for immediate appointment if use goes up over your usual need - Don't leave home without it !!  (think of it like the spare tire for your car)    Please remember to go to the lab department @ [facility] for your tests - we will call you with the results when they are available.   We will call to set up ultrasound biopsy for the lymph node    The key is to stop smoking completely before smoking completely stops you!   I will  call when results back and we'll go from there

## 2020-08-07 NOTE — Progress Notes (Signed)
Subjective:    Patient ID: Theresa Villa, female   DOB: 06/05/1956,     MRN: [id number]    Brief patient profile:  34 yowf active smoker with dx discoid lupus /fibromyalgia followed x 30 years at [facility] with onset cough May/ June 2018 productive yellow mucus abx/ prednisone rx by Dr Sherril Croon and xray abn > CT Chest      11/17/2017 1st [facility] office visit/ Theresa Villa   Chief Complaint  Patient presents with  . Pulmonary Consult    Referred by Dr. Sherril Croon for eval of pulmonary nodules. Pt c/o occ CP "pretty constant" sinc May 2018. The pain radiates under her Left breast   She also c/o DOE with walking short distances when she gets in a hurry. She has a prod cough with clear sputum.    cough x May/ June 2018 then cp under L breast cp dull pain not pleuritic at all / present 24/7  Clear is worse first thing in am x up to hour but mucus is mostly clear now Doe = MMRC3 = can't walk 100 yards even at a slow pace at a flat grade s stopping due to sob but still able to do HT slowly / very little housework, uses [employer] parking Some better p saba though hfa only about 50% effective at baseline and only using symb 160 1 bid but not first thing in am (uses  saba first)  rec Symbicort 160 Take 2 puffs first thing in am and then another 2 puffs about 12 hours later.  Only use your albuterol as a rescue medication  Work on inhaler technique RML syndrome > rec fob / pt canceled    05/12/2020  Pulmonary consultation  Re  Lung mass/ prednisone 5 mg one  Chief Complaint  Patient presents with  . Follow-up    Last seen in 2019 and referred back Dr Dr Lissa Hoard for eval of abnormal cxr and ct chest.   w/in 24 h of moderna March 21st 2021  But  not treated until end of May = 04/08/20 with  5 days of zmax  and prednisone x 7 days then continued on prednisone daily  Dyspnea:  Walking slow pace anywhere she wants to go  Cough: yellow mucus  Sleeping: wakes up coughing nightly  SABA use: symbicort 2 bid/ albuterol 02:  none  rec  Plan A = Automatic = Always=    symbicort 160 Take 2 puffs first thing in am and then another 2 puffs about 12 hours later.  Work on inhaler technique:  Plan B = Backup (to supplement plan A, not to replace it) Only use your albuterol inhaler as a rescue medication  Plan C = Crisis (instead of Plan B but only if Plan B stops working) - only use your albuterol nebulizer if you first try Plan B and it fails to help > ok to use the nebulizer up to every 4 hours   Augmentin 875 mg take one pill twice daily  X 10 days - take at breakfast and supper with large glass of water.  Please remember to go to the lab and x-ray department   for your tests - we will call you with the results when they are available   06/03/2020  f/u ov/[place] office/Theresa Villa re: ? Lung abscess  Chief Complaint  Patient presents with  . Follow-up    Patient states that she feels much better since last visit. Productive cough with clear/yellow mucus. Augmentin helped a lot.  Dyspnea:  Not limited by breathing from desired activities   Cough: green yellow > white  Sleeping: no noct cough  SABA use: still using couple times a day  on symb 160 2bid / never neb  02: none  rec Stop smoking /f/u studies: - Quant Gold TB 05/12/2020  Neg  -  histo/ crypto serologies 05/12/2020  - CT p rx augmentin/ clindamycin  Then CT  07/27/2020  (@ still 4 days left on clindamyin: 10.7 cm cavitary mass in the left upper lobe, unchanged from recent CT, new from 2018.  PET  08/04/20  1. Large thick-walled cavitary area in the LEFT upper lobe associated with paramediastinal soft tissue and bronchiectasis remains suspicious for neoplasm. There are other findings in the chest that raise the question of chronic infection perhaps with nontuberculous mycobacterial process. Bronchoscopic assessment is needed to differentiate between these 2 possibilities. Discussion in multi disciplinary thoracic oncologic setting may also  be beneficial. 2. LEFT supraclavicular lymph node with mild increased metabolism but above mediastinal blood pool. Given proximity to the the above process focused ultrasound assessment and potentially biopsy may be of benefit based on morphologic characteristics of a lymph node at ultrasound. Though findings remain nonspecific. 3. Severe pulmonary emphysema.   08/07/2020  f/u ov/[place] office/Theresa Villa re:  Copd / f/u cavitary mass ? Atypical tb vs lung ca ?  Cut down on cigs  Chief Complaint  Patient presents with  . Follow-up    productive cough with yellow phlegm  Dyspnea: very sedentary but now starting exercise program  Cough: clindamycin did the best > yellowish phlegm never bloody turned clear while on it /always worse in am  Sleeping:  Bed is flat with 30 degrees pillows = baseline x years  SABA use: three x per day, never rechallenges  02: sometimes Plaquenil per [place] for discoid lupus/ prednisone off x one week    No obvious patterns  day to day or daytime variability or assoc   mucus plugs or hemoptysis or cp or chest tightness, subjective wheeze or overt sinus or hb symptoms.   Sleeping as above without nocturnal  or early am exacerbation  of respiratory  c/o's or need for noct saba. Also denies any obvious fluctuation of symptoms with weather or environmental changes or other aggravating or alleviating factors except as outlined above   No unusual exposure hx or h/o childhood pna/ asthma or knowledge of premature birth.  Current Allergies, Complete Past Medical History, Past Surgical History, Family History, and Social History were reviewed in [employer] record.  ROS  The following are not active complaints unless bolded Hoarseness, sore throat, dysphagia, dental problems, itching, sneezing,  nasal congestion or discharge of excess mucus or purulent secretions, ear ache,   fever, chills, sweats, unintended wt loss or wt gain, classically  pleuritic or exertional cp,  orthopnea pnd or arm/hand swelling  or leg swelling, presyncope, palpitations, abdominal pain, anorexia, nausea, vomiting, diarrhea  or change in bowel habits or change in bladder habits, change in stools or change in urine, dysuria, hematuria,  rash, arthralgias, visual complaints, headache, numbness, weakness or ataxia or problems with walking or coordination,  change in mood or  memory.        Current Meds  Medication Sig  . albuterol (PROAIR HFA) 108 (90 Base) MCG/ACT inhaler Inhale 2 puffs into the lungs every 6 (six) hours as needed for wheezing or shortness of breath.   . ALPRAZolam (XANAX) 0.25 MG tablet Take 0.25  mg by mouth daily.   Marland Kitchen atenolol (TENORMIN) 25 MG tablet Take 25 mg by mouth daily.  Marland Kitchen atorvastatin (LIPITOR) 40 MG tablet Take 40 mg by mouth daily.  . budesonide-formoterol (SYMBICORT) 160-4.5 MCG/ACT inhaler Inhale 2 puffs into the lungs 2 (two) times daily.   . clobetasol (TEMOVATE) 0.05 % external solution Apply 1 application topically daily as needed (for lupus).  Marland Kitchen denosumab (PROLIA) 60 MG/ML SOLN injection Inject 60 mg into the skin every 6 (six) months. Administer in upper arm, thigh, or abdomen  . ELDERBERRY PO Take 1 capsule by mouth daily.   . halobetasol (ULTRAVATE) 0.05 % cream Apply 1 application topically 2 (two) times daily as needed (for lupus).   . hydroxychloroquine (PLAQUENIL) 200 MG tablet Take 200 mg by mouth daily.  Marland Kitchen ibuprofen (ADVIL,MOTRIN) 200 MG tablet Take 200 mg by mouth every 6 (six) hours as needed for headache or moderate pain.  . Multiple Vitamins-Minerals (MULTIVITAMIN WITH MINERALS) tablet Take 1 tablet by mouth daily.  Marland Kitchen tiZANidine (ZANAFLEX) 4 MG capsule Take 8 mg by mouth at bedtime.  . traMADol (ULTRAM) 50 MG tablet Take 50 mg by mouth 3 (three) times daily.   . vitamin C (ASCORBIC ACID) 500 MG tablet Take 500 mg by mouth daily.                 Objective:   Physical Exam    08/07/2020        92 06/03/2020       93   05/12/20 92 lb (41.7 kg)  11/17/17 95 lb 9.6 oz (43.4 kg)     amb wf rattling cough/ pleasant but tends to evade answering questions directly making accurate hx challenging   Vital signs reviewed  08/07/2020  - Note at rest 02 sats  90% on RA      Top dentures/ lower teeth  HEENT : pt wearing mask not removed for exam due to covid -19 concerns.    NECK :  without JVD/Nodes/TM/ nl carotid upstrokes bilaterally   LUNGS: no acc muscle use,  Mod barrel  contour chest wall with bilateral  Distant bs s audible wheeze and  without cough on insp or exp maneuvers and mod  Hyperresonant  to  percussion bilaterally     CV:  RRR  no s3 or murmur or increase in P2, and no edema   ABD:  soft and nontender with pos mid insp Hoover's  in the supine position. No bruits or organomegaly appreciated, bowel sounds nl  MS:     ext warm without deformities, calf tenderness, cyanosis or clubbing No obvious joint restrictions   SKIN: warm and dry without lesions    NEURO:  alert, approp, nl sensorium with  no motor or cerebellar deficits apparent.               Assessment:

## 2020-08-08 ENCOUNTER — Encounter: Payer: Self-pay | Admitting: Internal Medicine

## 2020-08-08 NOTE — Assessment & Plan Note (Signed)
New dx 04/18/20 post segment LUL   - 05/12/2020   Augmentin 875 mg take one pill twice daily  X 10 days > improved clinically and radiographically as of 06/03/2020  - Quant Gold TB 05/12/2020  Neg  -  histo/ crypto serologies 05/12/2020  - CT p rx augmentin/ clindamycin  Then CT  07/27/2020  (@ still 4 days left on clindamyin: 10.7 cm cavitary mass in the left upper lobe, unchanged from recent CT, new from 2018.  - PET rec 08/04/20 >>> 1. Large thick-walled cavitary area in the LEFT upper lobe associated with paramediastinal soft tissue and bronchiectasis remains suspicious for neoplasm. There are other findings in the chest that raise the question of chronic infection perhaps with nontuberculous mycobacterial process. Bronchoscopic assessment is needed to differentiate between these 2 possibilities. Discussion in multi disciplinary thoracic oncologic setting may also be beneficial. 2. LEFT supraclavicular lymph node with mild increased metabolism but above mediastinal blood pool. Given proximity to the the above process focused ultrasound assessment and potentially biopsy may be of benefit  3. Severe pulmonary emphysema. - rec 08/07/2020   After discussed with Dr Archer Asa IR >>>    Try [place] bx of L [place] node first then fob 2nd option,  Very likely not a surgical candidate - 08/07/2020 sputum sample for gm stain and culture, tb, fungus  See plan outlined above > Discussed in detail all the  indications, usual  risks and alternatives  relative to the benefits with patient who agrees to proceed with w/u as outlined.            Each maintenance medication was reviewed in detail including emphasizing most importantly the difference between maintenance and prns and under what circumstances the prns are to be triggered using an action plan format where appropriate.  Total time for H and P, chart review, counseling, teaching device and generating customized AVS unique to this office visit / charting = 32  min

## 2020-08-08 NOTE — Assessment & Plan Note (Addendum)
Active smoker Spirometry 11/17/2017  FEV1 1.01 (39%)  Ratio 44 p saba and symb though hfa poor @ baseline - 11/17/2017  After extensive coaching inhaler device  effectiveness =    75% (very short Ti) > rec increase symb 160 to 2 bid    - The proper method of use, as well as anticipated side effects, of a metered-dose inhaler were discussed and demonstrated to the patient using teachback. Improved effectiveness after extensive coaching during this visit to a level of approximately 75 % from a baseline of 50 %     Pattern is typical of AB so continue symbicort 2bid, consider change to breztri for Group D symptoms.  Using too much saba  I spent extra time with pt today reviewing appropriate use of albuterol for prn use on exertion with the following points: 1) saba is for relief of sob that does not improve by walking a slower pace or resting but rather if the pt does not improve after trying this first. 2) If the pt is convinced, as many are, that saba helps recover from activity faster then it's easy to tell if this is the case by re-challenging : ie stop, take the inhaler, then p 5 minutes try the exact same activity (intensity of workload) that just caused the symptoms and see if they are substantially diminished or not after saba 3) if there is an activity that reproducibly causes the symptoms, try the saba 15 min before the activity on alternate days   If in fact the saba really does help, then fine to continue to use it prn but advised may need to look closer at the maintenance regimen (breztri)  being used to achieve better control of airways disease with exertion.

## 2020-08-10 ENCOUNTER — Encounter (HOSPITAL_COMMUNITY): Payer: Self-pay

## 2020-08-10 NOTE — Progress Notes (Unsigned)
Theresa Villa Female, 64 y.o., 08-31-1956 MRN:  [id number] Phone:  [phone] (H) PCP:  Ignatius Specking, MD Primary Cvg:  Medicare/Medicare Part A And B  RE: Biopsy Received: Today Message Details  Malachy Moan, MD  Lillia Mountain E Approved for attempt at US guided core biopsy of left supraclavicular LN seen on PET. At the time of biopsy, node may be found to be too small to target.    HKM   Previous Messages  ----- Message -----  From: Cory Munch  Sent: 08/07/2020  4:53 PM EDT  To: Malachy Moan, MD  Subject: Biopsy                      Procedure Requested:  US Biopsy (lymph nodes)    Reason for Procedure: adenopathy    Provider Requesting: Nyoka Cowden  Provider Telephone: [phone]    Other Info: In Dr Thurston Hole message it said " Dr Archer Asa approved and will do"   Unless I am over looking the review, I don't see it.

## 2020-08-12 ENCOUNTER — Other Ambulatory Visit (HOSPITAL_COMMUNITY): Payer: Medicare Other

## 2020-08-14 ENCOUNTER — Telehealth: Payer: Self-pay | Admitting: Internal Medicine

## 2020-08-14 DIAGNOSIS — J984 Other disorders of lung: Secondary | ICD-10-CM

## 2020-08-14 NOTE — Telephone Encounter (Signed)
Left message for patient's husband to call back.  

## 2020-08-14 NOTE — Telephone Encounter (Signed)
Primary Pulmonologist: Dr.Wert Last office visit and with whom: 08/07/20 What do we see them for (pulmo/nary problems):Cavitating mass un left upper lung lobe,COPD   Last OV assessment/plan:  Assessment & Plan Note by Nyoka Cowden, MD at 08/08/2020 6:01 AM  Author: Nyoka Cowden, MD Author Type: Physician Filed: 08/08/2020 6:06 AM  Note Status: Carl[place] Cater: Cosign Not Required Encounter Date: 08/07/2020  Problem: COPD GOLD III  Editor: Nyoka Cowden, MD (Physician)      Prior Versions: 1. Nyoka Cowden, MD (Physician) at 08/08/2020 6:02 AM - Written    Active smoker Spirometry 11/17/2017  FEV1 1.01 (39%)  Ratio 44 p saba and symb though hfa poor @ baseline - 11/17/2017  After extensive coaching inhaler device  effectiveness =    75% (very short Ti) > rec increase symb 160 to 2 bid    - The proper method of use, as well as anticipated side effects, of a metered-dose inhaler were discussed and demonstrated to the patient using teachback. Improved effectiveness after extensive coaching during this visit to a level of approximately 75 % from a baseline of 50 %     Pattern is typical of AB so continue symbicort 2bid, consider change to breztri for Group D symptoms.  Using too much saba  I spent extra time with pt today reviewing appropriate use of albuterol for prn use on exertion with the following points: 1) saba is for relief of sob that does not improve by walking a slower pace or resting but rather if the pt does not improve after trying this first. 2) If the pt is convinced, as many are, that saba helps recover from activity faster then it's easy to tell if this is the case by re-challenging : ie stop, take the inhaler, then p 5 minutes try the exact same activity (intensity of workload) that just caused the symptoms and see if they are substantially diminished or not after saba 3) if there is an activity that reproducibly causes the symptoms, try the saba 15 min before the activity  on alternate days   If in fact the saba really does help, then fine to continue to use it prn but advised may need to look closer at the maintenance regimen (breztri)  being used to achieve better control of airways disease with exertion.     Assessment & Plan Note by Nyoka Cowden, MD at 08/08/2020 6:03 AM Author: Nyoka Cowden, MD Author Type: Physician Filed: 08/08/2020 6:05 AM  Note Status: Written Cosign: Cosign Not Required Encounter Date: 08/07/2020  Problem: 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  Editor: Nyoka Cowden, MD (Physician)               New dx 04/18/20 post segment LUL   - 05/12/2020   Augmentin 875 mg take one pill twice daily  X 10 days > improved clinically and radiographically as of 06/03/2020  - Quant Gold TB 05/12/2020  Neg  -  histo/ crypto serologies 05/12/2020  - CT p rx augmentin/ clindamycin  Then CT  07/27/2020  (@ still 4 days left on clindamyin: 10.7 cm cavitary mass in the left upper lobe, unchanged from recent CT, new from 2018.  - PET rec 08/04/20 >>> 1. Large thick-walled cavitary area in the LEFT upper lobe associated with paramediastinal soft tissue and bronchiectasis remains suspicious for neoplasm. There are other findings in the chest that raise the question of chronic infection perhaps with nontuberculous mycobacterial process.  Bronchoscopic assessment is needed to differentiate between these 2 possibilities. Discussion in multi disciplinary thoracic oncologic setting may also be beneficial. 2. LEFT supraclavicular lymph node with mild increased metabolism but above mediastinal blood pool. Given proximity to the the above process focused ultrasound assessment and potentially biopsy may be of benefit  3. Severe pulmonary emphysema. - rec 08/07/2020   After discussed with Dr Archer Asa IR >>>    Try [place] bx of L [place] node first then fob 2nd option,  Very likely not a surgical candidate - 08/07/2020 sputum sample for gm stain and culture, tb,  fungus  See plan outlined above > Discussed in detail all the  indications, usual  risks and alternatives  relative to the benefits with patient who agrees to proceed with w/u as outlined.            Each maintenance medication was reviewed in detail including emphasizing most importantly the difference between maintenance and prns and under what circumstances the prns are to be triggered using an action plan format where appropriate.  Total time for H and P, chart review, counseling, teaching device and generating customized AVS unique to this office visit / charting = 32 min           Patient Instructions by Nyoka Cowden, MD at 08/07/2020 11:00 AM Author: Nyoka Cowden, MD Author Type: Physician Filed: 08/07/2020 11:31 AM  Note Status: Addendum Cosign: Cosign Not Required Encounter Date: 08/07/2020  Editor: Nyoka Cowden, MD (Physician)      Prior Versions: 1. Nyoka Cowden, MD (Physician) at 08/07/2020 11:26 AM - Addendum   2. Nyoka Cowden, MD (Physician) at 08/07/2020 11:17 AM - Signed    Make sure you check your oxygen saturations at highest level of activity to be sure it stays over 90% and adjust upward to maintain this level if needed but remember to turn it back to previous settings when you stop (to conserve your supply).   Only use your albuterol as a rescue medication to be used if you can't catch your breath by resting or doing a relaxed purse lip breathing pattern.  - The less you use it, the better it will work when you need it. - Ok to use up to 2 puffs  every 4 hours if you must but call for immediate appointment if use goes up over your usual need - Don't leave home without it !!  (think of it like the spare tire for your car)    Please remember to go to the lab department @ [facility] for your tests - we will call you with the results when they are available.   We will call to set up ultrasound biopsy for the lymph node    The key is  to stop smoking completely before smoking completely stops you!   I will  call when results back and we'll go from there             Instructions  Make sure you check your oxygen saturations at highest level of activity to be sure it stays over 90% and adjust upward to maintain this level if needed but remember to turn it back to previous settings when you stop (to conserve your supply).   Only use your albuterol as a rescue medication to be used if you can't catch your breath by resting or doing a relaxed purse lip breathing pattern.  - The less you use it, the better it  will work when you need it. - Ok to use up to 2 puffs  every 4 hours if you must but call for immediate appointment if use goes up over your usual need - Don't leave home without it !!  (think of it like the spare tire for your car)    Please remember to go to the lab department @ [facility] for your tests - we will call you with the results when they are available.   We will call to set up ultrasound biopsy for the lymph node    The key is to stop smoking completely before smoking completely stops you!   I will  call when results back and we'll go from there                 After Visit Summary (Printed 08/07/2020) Communications    [facility] Provider CC Chart Rep sent to Ignatius Specking, MD and Ignatius Specking, MD  Sent 08/08/2020 Media From this encounter Electronic signature on 08/07/2020 10:52 AM - 1 of 3 e-signatures recorded  Communication Routing History  Recipient Method Sent by Date Sent  Ignatius Specking, MD Fax Nyoka Cowden, MD 08/08/2020  Fax: [phone]  Phone: [phone]   Ignatius Specking, MD Fax Nyoka Cowden, MD 08/08/2020  Fax: [phone]  Phone: [phone]   No questionnaires available.           Orders Placed   AFB Culture & Smear  Fungus Culture & Smear  Respiratory or Resp and Sputum Culture  [place] CORE BIOPSY (LYMPH NODES) Medication Changes       (Completed Course)  Patient not taking:  Reported on 08/07/2020     (Completed Course)  Patient not taking:  Reported on 08/07/2020   Medication List  Visit Diagnoses    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   COPD GOLD III   Problem List  Level of Service  Level of Service  PR OFFICE/OUTPATIENT ESTABLISHED MOD MDM 30-39 MIN [32355]  All Charges for This Encounter  Code Description Service Date Service Provider Modifiers Qty  99214 PR OFFICE/OUTPATIENT ESTABLISHED MOD MDM 30-39 MIN 08/07/2020 Nyoka Cowden, MD  1  [phone] PR PATIENT SCREENED TOBACCO USE, RECEIVED CESSATION CONSELING IF USER 08/07/2020 Nyoka Cowden, MD      Was appointment offered to patient (explain)?     Reason for call:Coughig with yellow mucus,Congestion,No fever or sore throat. Patient o2 was 71 when patient woke up this morning around 6ish am . Patient's husband Dannielle Huh put patient on o2 3L and o2 was between 91-93.Bp was up 162/68. Advised patient if o2 drops below 89 patient needs to seek emergency care.  Dr.Wert can you please advise  Thank you  Allergies  Allergen Reactions  . Ciprofloxacin Other (See Comments)    Severe hypotension  . Codeine Itching  . Fosamax [Alendronate Sodium] Other (See Comments)    Numbness, lips were numb, stomach problems  . Sulfa Antibiotics Nausea And Vomiting  . Ivp Dye [Iodinated Diagnostic Agents] Rash    Immunization History  Administered Date(s) Administered  . Moderna SARS-COVID-2 Vaccination 01/04/2020, 02/01/2020

## 2020-08-14 NOTE — Telephone Encounter (Signed)
Patient's husband called back stating patient was sent to hospital in [place].  Wanted to let Dr. Sherene Sires know.  Also wondering about sputum sample.  Please advise.

## 2020-08-14 NOTE — Telephone Encounter (Signed)
Pt's husband on [place].  [phone]

## 2020-08-18 ENCOUNTER — Telehealth: Payer: Self-pay | Admitting: Internal Medicine

## 2020-08-18 NOTE — Telephone Encounter (Signed)
Called and spoke to pt's husband, Dannielle Huh. He states the pt dropped off the sputum sample on 9.24 at [facility] lab. However, there is nothing indicating in the pt's chart that the sputum was processed. ATC lab [facility] lab but the line continues to ring without VM or pick up.   Verlon Au, do you know of a better number for [facility] lab or any insight of pt's sputum sample that was dropped off at [facility] lab? Thanks.

## 2020-08-18 NOTE — Telephone Encounter (Signed)
See encounter from 10/1 as this is a duplicate encounter to that message.

## 2020-08-20 NOTE — Telephone Encounter (Signed)
Fine with me

## 2020-08-20 NOTE — Telephone Encounter (Signed)
Called the lab at [facility] with Ladona Ridgel  She states that they did not have order for labs and did not know anything about sputum culture  I have placed new orders for future labs  Called pt's spouse and he states she is not coughing much now and unable to produce sample He states that the just got out of the hospital in [place] and is on pred, augmentin and levaquin  She is scheduled now to f/u with a pulmonologist closer to them in [place]  She did not set up the US biopsy b/c she was not feeling up to it  Will forward to Dr Sherene Sires to make him aware

## 2021-07-28 IMAGING — DX DG CHEST 2V
2 series · 2 of 2 positions shown · non-contrast
Comparison: PA and lateral chest 04/08/2020.  CT chest 04/17/2020.

CLINICAL DATA: Cavitating left upper lobe mass.

EXAM:
CHEST - 2 VIEW

[chest pa]
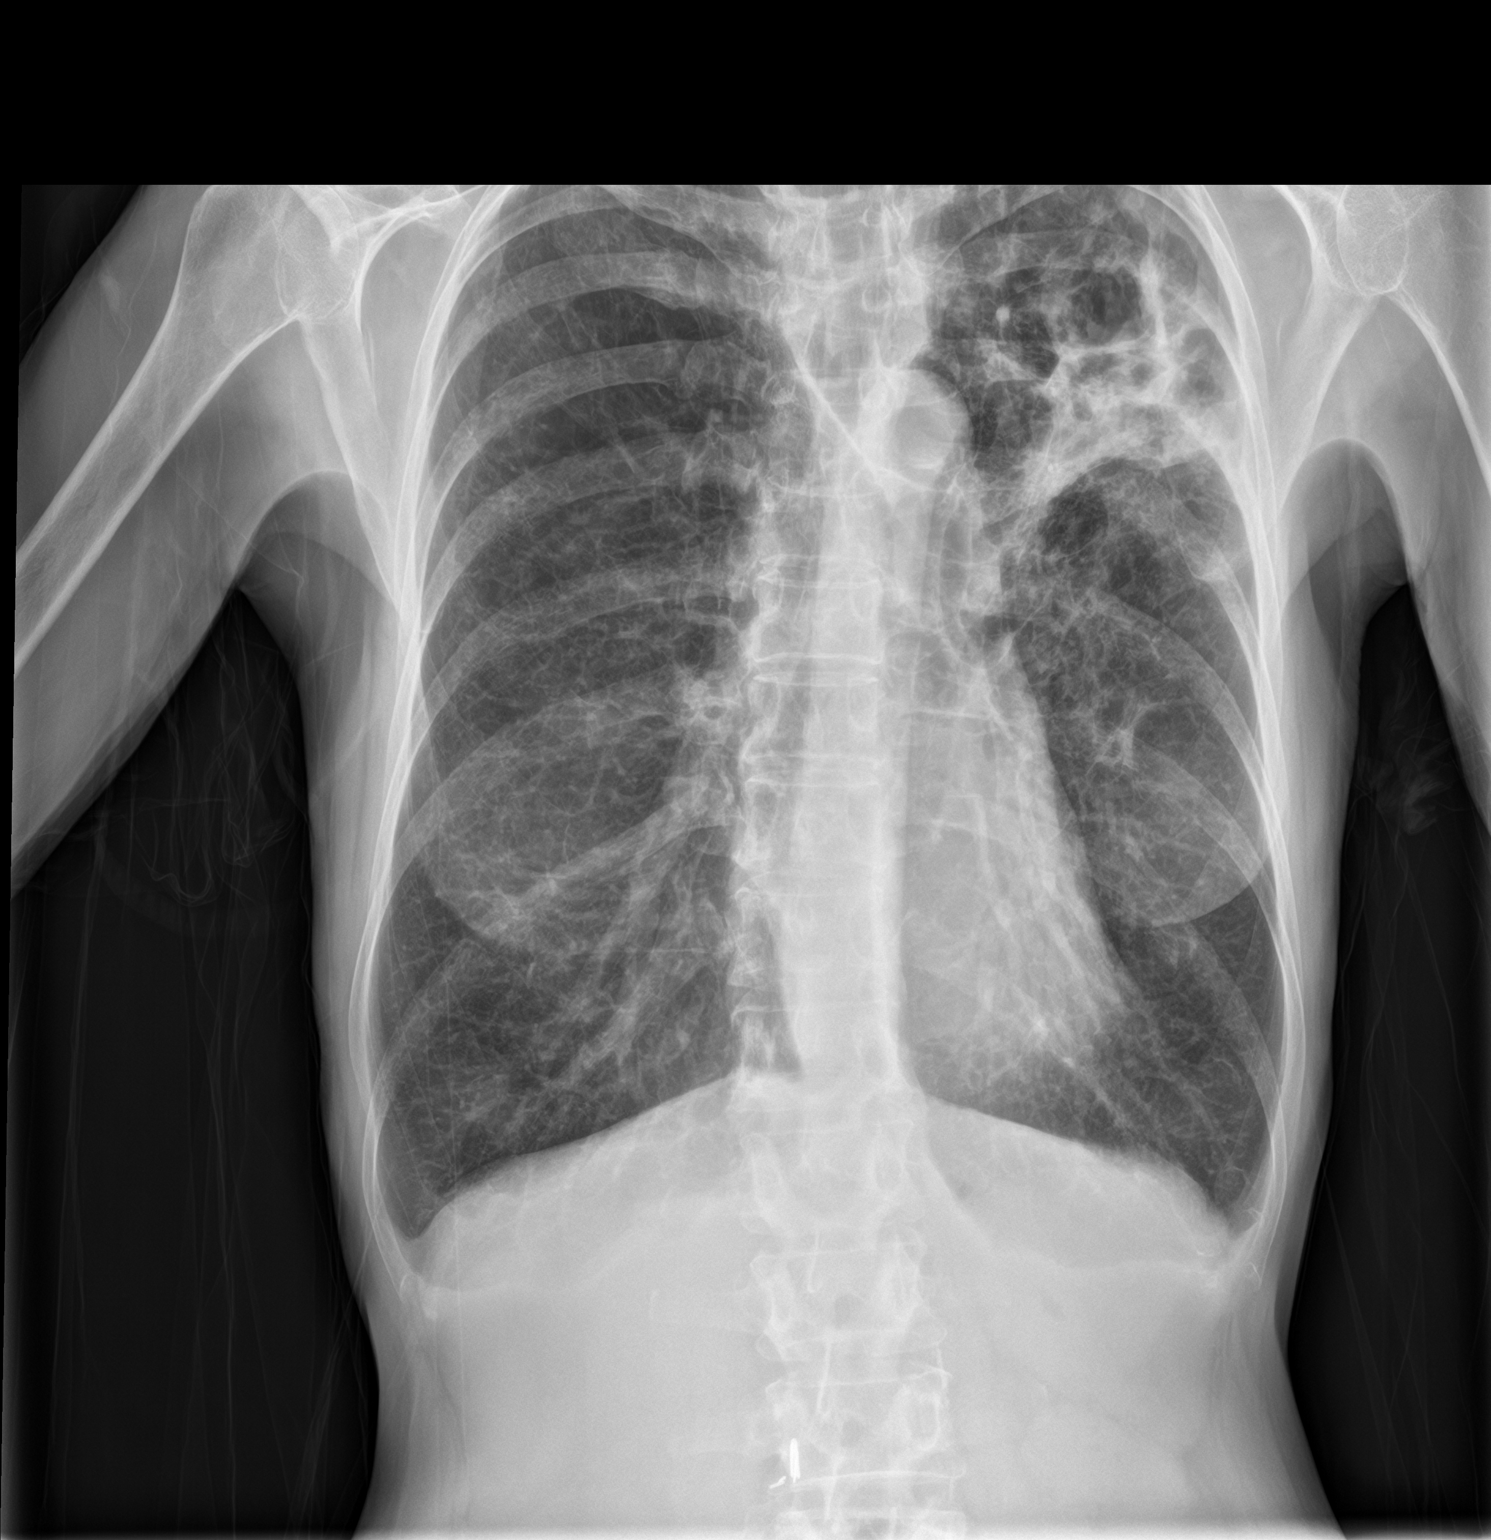

[chest lat]
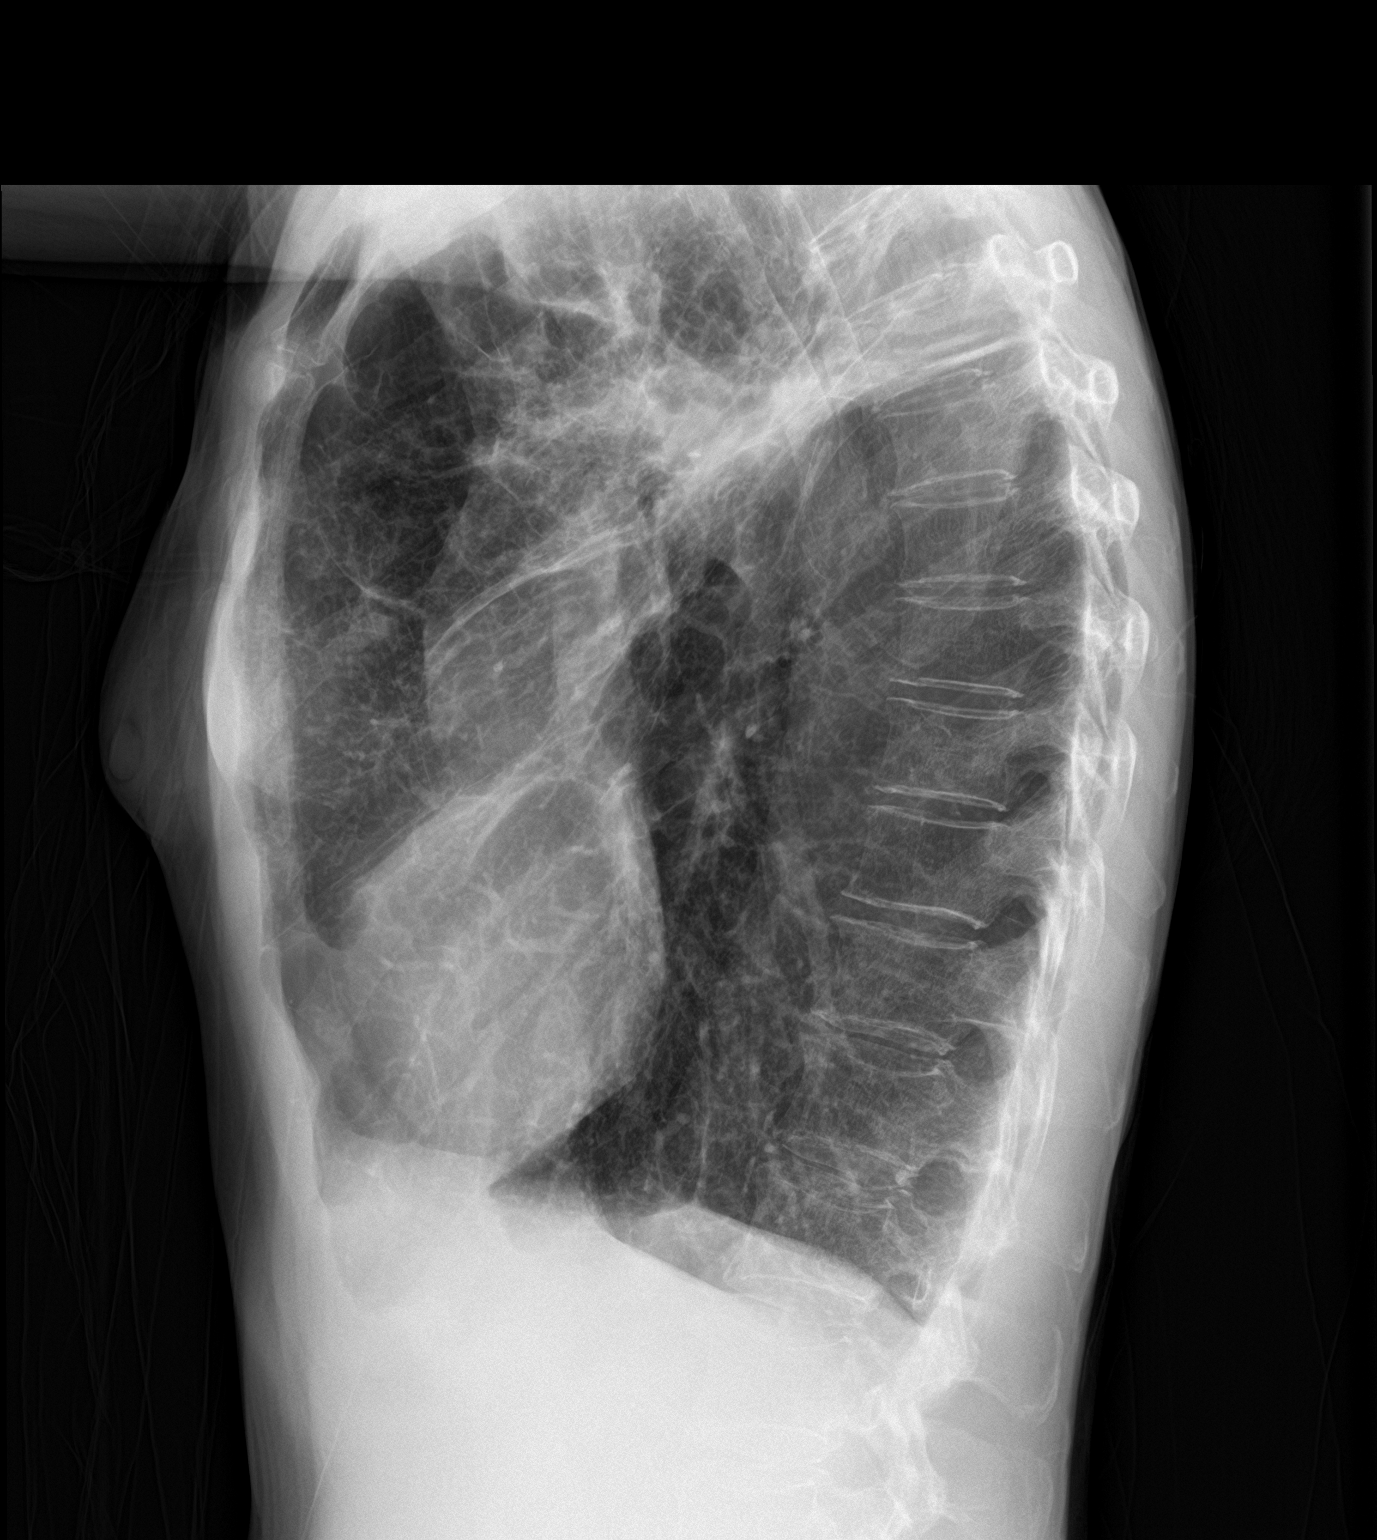

[2 of 2 positions shown; findings below may reference images not displayed]

FINDINGS: The lungs are severely emphysematous. Cavitary lesion in the left
upper lobe is unchanged in appearance. The right lung is clear. No
pneumothorax or pleural effusion. Heart size is normal.
Atherosclerosis noted.
IMPRESSION: No change in a cavitary mass like opacity in the left upper lobe
worrisome for carcinoma.

Aortic Atherosclerosis (JG2EY-JYK.K) and Emphysema ([id number].W).

## 2021-09-20 ENCOUNTER — Inpatient Hospital Stay
Admit: 2021-09-20 | Discharge: 2021-10-18 | Disposition: A | Discharge status: Other Acute Inpatient Hospital | Payer: Medicare Other | Source: Ambulatory Visit

## 2021-09-20 DIAGNOSIS — Z09 Encounter for follow-up examination after completed treatment for conditions other than malignant neoplasm: Secondary | ICD-10-CM

## 2021-09-20 DIAGNOSIS — J9621 Acute and chronic respiratory failure with hypoxia: Secondary | ICD-10-CM

## 2021-09-20 DIAGNOSIS — J984 Other disorders of lung: Secondary | ICD-10-CM

## 2021-09-20 DIAGNOSIS — J90 Pleural effusion, not elsewhere classified: Secondary | ICD-10-CM

## 2021-09-20 DIAGNOSIS — J449 Chronic obstructive pulmonary disease, unspecified: Secondary | ICD-10-CM

## 2021-09-20 DIAGNOSIS — J9690 Respiratory failure, unspecified, unspecified whether with hypoxia or hypercapnia: Secondary | ICD-10-CM

## 2021-09-20 DIAGNOSIS — A310 Pulmonary mycobacterial infection: Secondary | ICD-10-CM

## 2021-09-20 DIAGNOSIS — T85598A Other mechanical complication of other gastrointestinal prosthetic devices, implants and grafts, initial encounter: Secondary | ICD-10-CM

## 2021-09-20 DIAGNOSIS — J189 Pneumonia, unspecified organism: Secondary | ICD-10-CM

## 2021-09-20 LAB — BLOOD GAS, ARTERIAL
Acid-Base Excess: 4.8 mmol/L — ABNORMAL HIGH (ref 0.0–2.0)
Bicarbonate: 29 mmol/L — ABNORMAL HIGH (ref 20.0–28.0)
FIO2: 60
O2 Saturation: 93.8 %
Patient temperature: 37
pCO2 arterial: 44.9 mmHg (ref 32.0–48.0)
pH, Arterial: 7.426 (ref 7.350–7.450)
pO2, Arterial: 70 mmHg — ABNORMAL LOW (ref 83.0–108.0)

## 2021-09-21 ENCOUNTER — Other Ambulatory Visit (HOSPITAL_COMMUNITY): Payer: Medicare Other

## 2021-09-21 DIAGNOSIS — J449 Chronic obstructive pulmonary disease, unspecified: Secondary | ICD-10-CM

## 2021-09-21 DIAGNOSIS — J189 Pneumonia, unspecified organism: Secondary | ICD-10-CM

## 2021-09-21 DIAGNOSIS — J984 Other disorders of lung: Secondary | ICD-10-CM | POA: Diagnosis not present

## 2021-09-21 DIAGNOSIS — J9621 Acute and chronic respiratory failure with hypoxia: Secondary | ICD-10-CM | POA: Diagnosis not present

## 2021-09-21 DIAGNOSIS — A310 Pulmonary mycobacterial infection: Secondary | ICD-10-CM

## 2021-09-21 LAB — COMPREHENSIVE METABOLIC PANEL
ALT: 19 U/L (ref 0–44)
AST: 15 U/L (ref 15–41)
Albumin: 2.4 g/dL — ABNORMAL LOW (ref 3.5–5.0)
Alkaline Phosphatase: 77 U/L (ref 38–126)
Anion gap: 8 (ref 5–15)
BUN: 32 mg/dL — ABNORMAL HIGH (ref 8–23)
CO2: 30 mmol/L (ref 22–32)
Calcium: 8.5 mg/dL — ABNORMAL LOW (ref 8.9–10.3)
Chloride: 97 mmol/L — ABNORMAL LOW (ref 98–111)
Creatinine, Ser: <0.3 mg/dL — ABNORMAL LOW (ref 0.44–1.00)
Glucose, Bld: 106 mg/dL — ABNORMAL HIGH (ref 70–99)
Potassium: 3.2 mmol/L — ABNORMAL LOW (ref 3.5–5.1)
Sodium: 135 mmol/L (ref 135–145)
Total Bilirubin: 0.9 mg/dL (ref 0.3–1.2)
Total Protein: 4.9 g/dL — ABNORMAL LOW (ref 6.5–8.1)

## 2021-09-21 LAB — CBC
HCT: 34.1 % — ABNORMAL LOW (ref 36.0–46.0)
Hemoglobin: 10.6 g/dL — ABNORMAL LOW (ref 12.0–15.0)
MCH: 23 pg — ABNORMAL LOW (ref 26.0–34.0)
MCHC: 31.1 g/dL (ref 30.0–36.0)
MCV: 74.1 fL — ABNORMAL LOW (ref 80.0–100.0)
Platelets: 148 10*3/uL — ABNORMAL LOW (ref 150–400)
RBC: 4.6 MIL/uL (ref 3.87–5.11)
RDW: 18.8 % — ABNORMAL HIGH (ref 11.5–15.5)
WBC: 20.3 10*3/uL — ABNORMAL HIGH (ref 4.0–10.5)
nRBC: 0 % (ref 0.0–0.2)

## 2021-09-21 NOTE — Consult Note (Signed)
Pulmonary Critical Care Medicine [facility] [place]  PULMONARY SERVICE  Date of Service: 09/21/2021  PULMONARY CRITICAL CARE Theresa Villa  KCM:[id number]  DOB: 11/26/1955   DOA: 09/20/2021  Referring Physician: Luna Kitchens, MD  HPI: Theresa Villa is a 65 y.o. female seen for follow up of Acute on Chronic Respiratory Failure.  Patient has multiple medical problems including severe COPD fibromyalgia hypertension respiratory failure oxygen dependence who presents to the hospital because of increasing shortness of breath.  Patient apparently was found to be severely hypoxic ended up being intubated placed on mechanical ventilation.  She has been on 40 to 70% oxygen and has continued to require high oxygen levels now she is on 70% FiO2.  I spoke to the husband apparently was seeing a pulmonologist in [place] had sputum is growing MAI.  Apparently back in January patient was stable and it was felt that there was no need to treat the MAI.  Husband states that she has had a fairly steady decline.  There was some increased resistance with Cipro moxifloxacin rifampin Doxy cycling minocycline linezolid trimethoprim sulfa were all resistant and clarithromycin was intermediate patient did have sensitivities and susceptibility to amikacin.  At the time of presentation here she is orally intubated she is requiring 70% FiO2 and her PEEP was increased to 10  Review of Systems:  ROS performed and is unremarkable other than noted above.  Past medical history: Severe COPD Fibromyalgia MAI Oxygen dependent  Past surgical history: Noncontributory to present illness  Social History:    reports that she has been smoking cigarettes. She has a 45.00 pack-year smoking history. She has never used smokeless tobacco. She reports that she does not currently use alcohol. She reports that she does not use drugs.  Family History: Non-Contributory to the present  illness  Allergies  Allergen Reactions   Ciprofloxacin Other (See Comments)    Severe hypotension   Codeine Itching   Fosamax [Alendronate Sodium] Other (See Comments)    Numbness, lips were numb, stomach problems   Sulfa Antibiotics Nausea And Vomiting   Ivp Dye [Iodinated Diagnostic Agents] Rash    Medications: Reviewed on Rounds  Physical Exam:  Vitals: Temperature is 97.3 pulse 105 respiratory rate is 26 blood pressure 142/82 saturations 94%  Ventilator Settings on assist control FiO2 70% tidal volume 430 PEEP 10  General: Comfortable at this time Eyes: Grossly normal lids, irises & conjunctiva ENT: grossly tongue is normal Neck: no obvious mass Cardiovascular: S1-S2 normal no gallop or rub Respiratory: Scattered rhonchi expansion is equal Abdomen: Soft and nontender Skin: no rash seen on limited exam Musculoskeletal: not rigid Psychiatric:unable to assess Neurologic: no seizure no involuntary movements         Labs on Admission:  Basic Metabolic Panel: Recent Labs  Lab 09/21/21 0531  NA 135  K 3.2*  CL 97*  CO2 30  GLUCOSE 106*  BUN 32*  CREATININE <0.30*  CALCIUM 8.5*    Recent Labs  Lab 09/20/21 2105  PHART 7.426  PCO2ART 44.9  PO2ART 70.0*  HCO3 29.0*  O2SAT 93.8    Liver Function Tests: Recent Labs  Lab 09/21/21 0531  AST 15  ALT 19  ALKPHOS 77  BILITOT 0.9  PROT 4.9*  ALBUMIN 2.4*   No results for input(s): LIPASE, AMYLASE in the last 168 hours. No results for input(s): AMMONIA in the last 168 hours.  CBC: Recent Labs  Lab 09/21/21 0531  WBC 20.3*  HGB 10.6*  HCT 34.1*  MCV 74.1*  PLT 148*    Cardiac Enzymes: No results for input(s): CKTOTAL, CKMB, CKMBINDEX, TROPONINI in the last 168 hours.  BNP (last 3 results) No results for input(s): BNP in the last 8760 hours.  ProBNP (last 3 results) No results for input(s): PROBNP in the last 8760 hours.   Radiological Exams on Admission: DG Chest 1 View  Result Date:  09/21/2021 CLINICAL DATA:  NG tube placement, respiratory failure. EXAM: CHEST  1 VIEW COMPARISON:  06/03/2020. FINDINGS: The heart is normal in size. Atherosclerotic calcification of the aorta is noted. Emphysematous changes are noted in the lungs with bullous emphysema in the left upper lobe, increased lucency as compared with the prior exam. There is reduced lung volume on the left with mild mediastinal shift to the left. Apical pleural thickening is noted bilaterally. There are patchy opacities in the mid to lower left lung field and at the right lung base. There is blunting of the costophrenic angles bilaterally. An endotracheal tube terminates 5.7 cm above the carina. A right PICC line terminates over the superior vena cava. Enteric tube courses over the left upper quadrant and out of the field of view. IMPRESSION: 1. Chronic obstructive pulmonary disease with bullous changes at the left lung apex. Increased lucency is noted at the left lung apex with reduced lung volume on the left and mild mediastinal shift to the left and the possibility of pneumothorax cannot be completely excluded. Short-term follow-up chest radiograph or CT is recommended. 2. Airspace and interstitial airspace opacities in the mid to lower left lung and at the right lung base, possible infiltrate. 3. Small bilateral pleural effusions. Electronically Signed   By: Thornell Sartorius M.D.   On: 09/21/2021 01:18   DG Abd Portable 1V  Result Date: 09/21/2021 CLINICAL DATA:  Impaired nasogastric feeding tube. EXAM: PORTABLE ABDOMEN - 1 VIEW COMPARISON:  09/21/2021. FINDINGS: The bowel gas pattern is normal. An enteric tube terminates over the stomach. Surgical clips are present in the right upper quadrant. Patchy airspace disease is present in the mid to lower left lung field and right lung base. There is a small left pleural effusion. No acute osseous abnormality. IMPRESSION: 1. An enteric tube terminates over the stomach. 2. Remaining  findings are unchanged. Electronically Signed   By: Thornell Sartorius M.D.   On: 09/21/2021 02:32   DG Abd Portable 1V  Result Date: 09/21/2021 CLINICAL DATA:  NG tube placement, respiratory failure. EXAM: PORTABLE ABDOMEN - 1 VIEW COMPARISON:  07/27/2020. FINDINGS: The bowel gas pattern is normal. An enteric tube terminates over the stomach. Surgical clips are noted in the right upper quadrant. No acute osseous abnormality is identified. Emphysematous changes are present in the lungs. There are patchy parenchymal opacities bilaterally, greater on the left than on the right. There is blunting of the costophrenic angles bilaterally. IMPRESSION: 1. An enteric tube terminates in the stomach. 2. Nonobstructive bowel gas pattern. 3. Chronic obstructive pulmonary disease with scattered opacities in the lungs bilaterally, which may be infectious, inflammatory, or neoplastic. 4. Small bilateral pleural effusions. Electronically Signed   By: Thornell Sartorius M.D.   On: 09/21/2021 01:09    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   Acute on chronic respiratory failure with hypoxia (HCC)   Healthcare-associated pneumonia   MAI (mycobacterium avium-intracellulare) infection (HCC)   Acute on chronic respiratory failure with hypoxia patient has severe hypoxia Re[place]nauer chest x-ray reviewed  showing bullous emphysema reduced lung volumes severe interstitial airspace disease recommend CT scan follow-up which I will go ahead and order. MAI infection need to review her medications may need to consider nebulized therapy and also systemic therapy would get infectious diseases to help with the care Cavitary lesion of the left upper lobe the 8 entire lung on the left side appears to be basically 1 large cavity likely related to underlying emphysema as well as the mycobacterial AVM infection CT scan of the chest will be ordered Severe COPD advanced COPD based on history I do  not have any numbers as far as her FEV1 is concerned but based on what the husband is saying she had very severe disease and she was oxygen dependent. Healthcare associated pneumonia treated we will continue with supportive care  I have personally seen and evaluated the patient, evaluated laboratory and imaging results, formulated the assessment and plan and placed orders. The Patient requires high complexity decision making with multiple systems involvement.  Case was discussed on Rounds with the Respiratory Therapy Director and the Respiratory staff Time Spent  Yevonne Pax, MD [facility] Pulmonary Critical Care Medicine Sleep Medicine

## 2021-09-22 ENCOUNTER — Other Ambulatory Visit (HOSPITAL_COMMUNITY): Payer: Medicare Other

## 2021-09-22 DIAGNOSIS — J984 Other disorders of lung: Secondary | ICD-10-CM | POA: Diagnosis not present

## 2021-09-22 DIAGNOSIS — A310 Pulmonary mycobacterial infection: Secondary | ICD-10-CM

## 2021-09-22 DIAGNOSIS — J449 Chronic obstructive pulmonary disease, unspecified: Secondary | ICD-10-CM | POA: Diagnosis not present

## 2021-09-22 DIAGNOSIS — J9621 Acute and chronic respiratory failure with hypoxia: Secondary | ICD-10-CM | POA: Diagnosis not present

## 2021-09-22 DIAGNOSIS — J189 Pneumonia, unspecified organism: Secondary | ICD-10-CM | POA: Diagnosis not present

## 2021-09-22 LAB — CBC
HCT: 34.1 % — ABNORMAL LOW (ref 36.0–46.0)
Hemoglobin: 10.1 g/dL — ABNORMAL LOW (ref 12.0–15.0)
MCH: 22.7 pg — ABNORMAL LOW (ref 26.0–34.0)
MCHC: 29.6 g/dL — ABNORMAL LOW (ref 30.0–36.0)
MCV: 76.6 fL — ABNORMAL LOW (ref 80.0–100.0)
Platelets: 128 10*3/uL — ABNORMAL LOW (ref 150–400)
RBC: 4.45 MIL/uL (ref 3.87–5.11)
RDW: 19.7 % — ABNORMAL HIGH (ref 11.5–15.5)
WBC: 20.2 10*3/uL — ABNORMAL HIGH (ref 4.0–10.5)
nRBC: 0 % (ref 0.0–0.2)

## 2021-09-22 LAB — BASIC METABOLIC PANEL
Anion gap: 10 (ref 5–15)
BUN: 28 mg/dL — ABNORMAL HIGH (ref 8–23)
CO2: 27 mmol/L (ref 22–32)
Calcium: 8.3 mg/dL — ABNORMAL LOW (ref 8.9–10.3)
Chloride: 96 mmol/L — ABNORMAL LOW (ref 98–111)
Creatinine, Ser: 0.35 mg/dL — ABNORMAL LOW (ref 0.44–1.00)
GFR, Estimated: >60 mL/min (ref >60)
Glucose, Bld: 271 mg/dL — ABNORMAL HIGH (ref 70–99)
Potassium: 4.3 mmol/L (ref 3.5–5.1)
Sodium: 133 mmol/L — ABNORMAL LOW (ref 135–145)

## 2021-09-22 LAB — HEMOGLOBIN A1C
Hgb A1c MFr Bld: 7.4 % — ABNORMAL HIGH (ref 4.8–5.6)
Mean Plasma Glucose: 165.68 mg/dL

## 2021-09-22 LAB — TSH: TSH: 2.705 u[IU]/mL (ref 0.350–4.500)

## 2021-09-22 LAB — MAGNESIUM: Magnesium: 1.8 mg/dL (ref 1.7–2.4)

## 2021-09-22 NOTE — Progress Notes (Signed)
Pulmonary Critical Care Medicine [facility] [place]   PULMONARY CRITICAL CARE SERVICE  PROGRESS NOTE     Theresa Villa  NAT:[id number]  DOB: 1956-07-02   DOA: 09/20/2021  Referring Physician: Luna Kitchens, MD  HPI: Theresa Villa is a 65 y.o. female being followed for ventilator/airway/oxygen weaning Acute on Chronic Respiratory Failure.  She is afebrile on the ventilator remains on 55% FiO2 we were able to turn her oxygen down from 70% after making some adjustments on the ventilator.  CT scan was done which essentially shows the left lung to be 1 large cavity  Medications: Reviewed on Rounds  Physical Exam:  Vitals: Temperature is 98.8 pulse 97 respiratory is 29 blood pressure 109/59 saturations 97%  Ventilator Settings assist control FiO2 55% tidal volume 408 PEEP 7  General: Comfortable at this time Neck: supple Cardiovascular: no malignant arrhythmias Respiratory: Coarse breath sounds with a few scattered rhonchi Skin: no rash seen on limited exam Musculoskeletal: No gross abnormality Psychiatric:unable to assess Neurologic:no involuntary movements         Lab Data:   Basic Metabolic Panel: Recent Labs  Lab 09/21/21 0531 09/22/21 0453  NA 135 133*  K 3.2* 4.3  CL 97* 96*  CO2 30 27  GLUCOSE 106* 271*  BUN 32* 28*  CREATININE <0.30* 0.35*  CALCIUM 8.5* 8.3*  MG  --  1.8    ABG: Recent Labs  Lab 09/20/21 2105  PHART 7.426  PCO2ART 44.9  PO2ART 70.0*  HCO3 29.0*  O2SAT 93.8    Liver Function Tests: Recent Labs  Lab 09/21/21 0531  AST 15  ALT 19  ALKPHOS 77  BILITOT 0.9  PROT 4.9*  ALBUMIN 2.4*   No results for input(s): LIPASE, AMYLASE in the last 168 hours. No results for input(s): AMMONIA in the last 168 hours.  CBC: Recent Labs  Lab 09/21/21 0531 09/22/21 0453  WBC 20.3* 20.2*  HGB 10.6* 10.1*  HCT 34.1* 34.1*  MCV 74.1* 76.6*  PLT 148* 128*    Cardiac Enzymes: No results for input(s): CKTOTAL, CKMB,  CKMBINDEX, TROPONINI in the last 168 hours.  BNP (last 3 results) No results for input(s): BNP in the last 8760 hours.  ProBNP (last 3 results) No results for input(s): PROBNP in the last 8760 hours.  Radiological Exams: DG Chest 1 View  Result Date: 09/21/2021 CLINICAL DATA:  NG tube placement, respiratory failure. EXAM: CHEST  1 VIEW COMPARISON:  06/03/2020. FINDINGS: The heart is normal in size. Atherosclerotic calcification of the aorta is noted. Emphysematous changes are noted in the lungs with bullous emphysema in the left upper lobe, increased lucency as compared with the prior exam. There is reduced lung volume on the left with mild mediastinal shift to the left. Apical pleural thickening is noted bilaterally. There are patchy opacities in the mid to lower left lung field and at the right lung base. There is blunting of the costophrenic angles bilaterally. An endotracheal tube terminates 5.7 cm above the carina. A right PICC line terminates over the superior vena cava. Enteric tube courses over the left upper quadrant and out of the field of view. IMPRESSION: 1. Chronic obstructive pulmonary disease with bullous changes at the left lung apex. Increased lucency is noted at the left lung apex with reduced lung volume on the left and mild mediastinal shift to the left and the possibility of pneumothorax cannot be completely excluded. Short-term follow-up chest radiograph or CT is recommended. 2. Airspace and interstitial airspace opacities in the mid  to lower left lung and at the right lung base, possible infiltrate. 3. Small bilateral pleural effusions. Electronically Signed   By: Thornell Sartorius M.D.   On: 09/21/2021 01:18   CT CHEST WO CONTRAST  Result Date: 09/22/2021 CLINICAL DATA:  Possible pneumothorax EXAM: CT CHEST WITHOUT CONTRAST TECHNIQUE: Multidetector CT imaging of the chest was performed following the standard protocol without IV contrast. COMPARISON:  Chest radiograph dated  09/21/2021. CT chest dated 07/27/2020. FINDINGS: Cardiovascular: The heart is normal in size with leftward cardiomediastinal shift. No pericardial effusion. No evidence of thoracic aortic aneurysm. Atherosclerotic calcifications of the aortic arch. Right arm PICC terminates lower SVC. Mediastinum/Nodes: Small mediastinal lymph nodes which do not meet pathologic CT size criteria. Visualized thyroid is unremarkable. Lungs/Pleura: Endotracheal tube terminates 4.5 cm above the carina. 13.3 cm thick-walled cavitary lesion in the left upper lobe, corresponding to the radiographic abnormality, with associated bronchiectasis. While progressive from priors, and neoplasm is difficult to exclude, given the additional findings this is favored to be post infectious/inflammatory. Left lower lobe pneumonia with moderate left pleural effusion and superimposed left lower lobe atelectasis. Mild patchy/ground-glass opacity in the right lower lobe with clustered nodularity, reflecting mild right lower lobe pneumonia, with associated small right pleural effusion. Severe centrilobular and paraseptal emphysematous changes. No pneumothorax. Upper Abdomen: Visualized upper abdomen is notable for an enteric tube coursing the stomach, prior cholecystectomy, and vascular calcifications. Musculoskeletal: Moderate compression fracture at T5. Mild impression fracture at T6. These are new from 2021, but likely chronic. No retropulsion. IMPRESSION: 13.3 cm cavitary lesion in the left upper lobe, favored to be post infectious inflammatory. This corresponds to the radiographic abnormality. No evidence of pneumothorax. Multifocal pneumonia in the bilateral lower lobes. Superimposed left lower lobe atelectasis with volume loss. Moderate left pleural effusion.  Small right pleural effusion. Support apparatus as above. Aortic Atherosclerosis (ICD10-I70.0) and Emphysema (ICD10-J43.9). Electronically Signed   By: Charline Bills M.D.   On: 09/22/2021  03:40   DG Abd Portable 1V  Result Date: 09/21/2021 CLINICAL DATA:  Impaired nasogastric feeding tube. EXAM: PORTABLE ABDOMEN - 1 VIEW COMPARISON:  09/21/2021. FINDINGS: The bowel gas pattern is normal. An enteric tube terminates over the stomach. Surgical clips are present in the right upper quadrant. Patchy airspace disease is present in the mid to lower left lung field and right lung base. There is a small left pleural effusion. No acute osseous abnormality. IMPRESSION: 1. An enteric tube terminates over the stomach. 2. Remaining findings are unchanged. Electronically Signed   By: Thornell Sartorius M.D.   On: 09/21/2021 02:32   DG Abd Portable 1V  Result Date: 09/21/2021 CLINICAL DATA:  NG tube placement, respiratory failure. EXAM: PORTABLE ABDOMEN - 1 VIEW COMPARISON:  07/27/2020. FINDINGS: The bowel gas pattern is normal. An enteric tube terminates over the stomach. Surgical clips are noted in the right upper quadrant. No acute osseous abnormality is identified. Emphysematous changes are present in the lungs. There are patchy parenchymal opacities bilaterally, greater on the left than on the right. There is blunting of the costophrenic angles bilaterally. IMPRESSION: 1. An enteric tube terminates in the stomach. 2. Nonobstructive bowel gas pattern. 3. Chronic obstructive pulmonary disease with scattered opacities in the lungs bilaterally, which may be infectious, inflammatory, or neoplastic. 4. Small bilateral pleural effusions. Electronically Signed   By: Thornell Sartorius M.D.   On: 09/21/2021 01:09    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  Acute on chronic respiratory failure with hypoxia (HCC)   Healthcare-associated pneumonia   MAI (mycobacterium avium-intracellulare) infection (HCC)   Acute on chronic respiratory failure with hypoxia patient will be continued on mechanical ventilation I have asked for respiratory therapy to try to wean  the PEEP down if we can because of the large cavity on the left side she remains at risk for development of pneumothorax which could result in tension pneumothorax and possibly her death.  Have spoken with the husband not sure if he completely comprehends the situation but we will continue to keep him informed as well as the family Cavitating mass is not actually a mass but rather one large cavity of destroyed lung on the left side Healthcare associated pneumonia patient has been treated with antibiotics we will continue to monitor along closely MAI infection discussed with primary care team will need to review the sensitivities and consider treatment at this stage although not sure that it is going to be beneficial at this stage Severe COPD medical management   I have personally seen and evaluated the patient, evaluated laboratory and imaging results, formulated the assessment and plan and placed orders. The Patient requires high complexity decision making with multiple systems involvement.  Rounds were done with the Respiratory Therapy Director and Staff therapists and discussed with nursing staff also.  Yevonne Pax, MD [facility] Pulmonary Critical Care Medicine Sleep Medicine

## 2021-09-23 DIAGNOSIS — J984 Other disorders of lung: Secondary | ICD-10-CM | POA: Diagnosis not present

## 2021-09-23 DIAGNOSIS — J9621 Acute and chronic respiratory failure with hypoxia: Secondary | ICD-10-CM | POA: Diagnosis not present

## 2021-09-23 DIAGNOSIS — J189 Pneumonia, unspecified organism: Secondary | ICD-10-CM | POA: Diagnosis not present

## 2021-09-23 DIAGNOSIS — J449 Chronic obstructive pulmonary disease, unspecified: Secondary | ICD-10-CM | POA: Diagnosis not present

## 2021-09-23 NOTE — Progress Notes (Signed)
Pulmonary Critical Care Medicine [facility] [place]   PULMONARY CRITICAL CARE SERVICE  PROGRESS NOTE     Theresa Villa  XBJ:[id number]  DOB: Mar 31, 1956   DOA: 09/20/2021  Referring Physician: Luna Kitchens, MD  HPI: Theresa Villa is a 65 y.o. female being followed for ventilator/airway/oxygen weaning Acute on Chronic Respiratory Failure.  Patient is with low-grade fever remains on the ventilator and full support.  Patient's husband was present at the bedside.  I reviewed the current CT scan and I reviewed the CT scan from 1 year ago with the husband.  There has been significant progression of her lung disease with destruction of the left lung.  Medications: Reviewed on Rounds  Physical Exam:  Vitals: Temperature is 99.2 pulse 90 respiratory is 28 blood pressure is 141/63 saturations 100%  Ventilator Settings on assist control FiO2 is 55% PEEP of 7  General: Comfortable at this time Neck: supple Cardiovascular: no malignant arrhythmias Respiratory: No rhonchi very coarse breath sounds Skin: no rash seen on limited exam Musculoskeletal: No gross abnormality Psychiatric:unable to assess Neurologic:no involuntary movements         Lab Data:   Basic Metabolic Panel: Recent Labs  Lab 09/21/21 0531 09/22/21 0453  NA 135 133*  K 3.2* 4.3  CL 97* 96*  CO2 30 27  GLUCOSE 106* 271*  BUN 32* 28*  CREATININE <0.30* 0.35*  CALCIUM 8.5* 8.3*  MG  --  1.8    ABG: Recent Labs  Lab 09/20/21 2105  PHART 7.426  PCO2ART 44.9  PO2ART 70.0*  HCO3 29.0*  O2SAT 93.8    Liver Function Tests: Recent Labs  Lab 09/21/21 0531  AST 15  ALT 19  ALKPHOS 77  BILITOT 0.9  PROT 4.9*  ALBUMIN 2.4*   No results for input(s): LIPASE, AMYLASE in the last 168 hours. No results for input(s): AMMONIA in the last 168 hours.  CBC: Recent Labs  Lab 09/21/21 0531 09/22/21 0453  WBC 20.3* 20.2*  HGB 10.6* 10.1*  HCT 34.1* 34.1*  MCV 74.1* 76.6*  PLT 148* 128*     Cardiac Enzymes: No results for input(s): CKTOTAL, CKMB, CKMBINDEX, TROPONINI in the last 168 hours.  BNP (last 3 results) No results for input(s): BNP in the last 8760 hours.  ProBNP (last 3 results) No results for input(s): PROBNP in the last 8760 hours.  Radiological Exams: CT CHEST WO CONTRAST  Result Date: 09/22/2021 CLINICAL DATA:  Possible pneumothorax EXAM: CT CHEST WITHOUT CONTRAST TECHNIQUE: Multidetector CT imaging of the chest was performed following the standard protocol without IV contrast. COMPARISON:  Chest radiograph dated 09/21/2021. CT chest dated 07/27/2020. FINDINGS: Cardiovascular: The heart is normal in size with leftward cardiomediastinal shift. No pericardial effusion. No evidence of thoracic aortic aneurysm. Atherosclerotic calcifications of the aortic arch. Right arm PICC terminates lower SVC. Mediastinum/Nodes: Small mediastinal lymph nodes which do not meet pathologic CT size criteria. Visualized thyroid is unremarkable. Lungs/Pleura: Endotracheal tube terminates 4.5 cm above the carina. 13.3 cm thick-walled cavitary lesion in the left upper lobe, corresponding to the radiographic abnormality, with associated bronchiectasis. While progressive from priors, and neoplasm is difficult to exclude, given the additional findings this is favored to be post infectious/inflammatory. Left lower lobe pneumonia with moderate left pleural effusion and superimposed left lower lobe atelectasis. Mild patchy/ground-glass opacity in the right lower lobe with clustered nodularity, reflecting mild right lower lobe pneumonia, with associated small right pleural effusion. Severe centrilobular and paraseptal emphysematous changes. No pneumothorax. Upper Abdomen: Visualized upper abdomen  is notable for an enteric tube coursing the stomach, prior cholecystectomy, and vascular calcifications. Musculoskeletal: Moderate compression fracture at T5. Mild impression fracture at T6. These are new from  2021, but likely chronic. No retropulsion. IMPRESSION: 13.3 cm cavitary lesion in the left upper lobe, favored to be post infectious inflammatory. This corresponds to the radiographic abnormality. No evidence of pneumothorax. Multifocal pneumonia in the bilateral lower lobes. Superimposed left lower lobe atelectasis with volume loss. Moderate left pleural effusion.  Small right pleural effusion. Support apparatus as above. Aortic Atherosclerosis (ICD10-I70.0) and Emphysema (ICD10-J43.9). Electronically Signed   By: Charline Bills M.D.   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  Patient also should have the PEEP weaned down. Cavitating lung disease on the right and left patient has severe destruction of the pulmonary parenchyma secondary to MAI likely COPD emphysema Severe COPD nebulizers as needed prognosis is very poor Healthcare associated pneumonia has been treated with infection MAI infectious disease has been called to see the patient   I have personally seen and evaluated the patient, evaluated laboratory and imaging results, formulated the assessment and plan and placed orders. The Patient requires high complexity decision making with multiple systems involvement.  Rounds were done with the Respiratory Therapy Director and Staff therapists and discussed with nursing staff also.  Yevonne Pax, MD [facility] Pulmonary Critical Care Medicine Sleep Medicine

## 2021-09-24 DIAGNOSIS — J449 Chronic obstructive pulmonary disease, unspecified: Secondary | ICD-10-CM | POA: Diagnosis not present

## 2021-09-24 DIAGNOSIS — J189 Pneumonia, unspecified organism: Secondary | ICD-10-CM | POA: Diagnosis not present

## 2021-09-24 DIAGNOSIS — J9621 Acute and chronic respiratory failure with hypoxia: Secondary | ICD-10-CM | POA: Diagnosis not present

## 2021-09-24 DIAGNOSIS — J984 Other disorders of lung: Secondary | ICD-10-CM | POA: Diagnosis not present

## 2021-09-24 LAB — CBC
HCT: 35.2 % — ABNORMAL LOW (ref 36.0–46.0)
Hemoglobin: 10.6 g/dL — ABNORMAL LOW (ref 12.0–15.0)
MCH: 23.1 pg — ABNORMAL LOW (ref 26.0–34.0)
MCHC: 30.1 g/dL (ref 30.0–36.0)
MCV: 76.9 fL — ABNORMAL LOW (ref 80.0–100.0)
Platelets: 190 10*3/uL (ref 150–400)
RBC: 4.58 MIL/uL (ref 3.87–5.11)
RDW: 20.2 % — ABNORMAL HIGH (ref 11.5–15.5)
WBC: 20.6 10*3/uL — ABNORMAL HIGH (ref 4.0–10.5)
nRBC: 0 % (ref 0.0–0.2)

## 2021-09-24 LAB — BLOOD GAS, ARTERIAL
Acid-Base Excess: 8.8 mmol/L — ABNORMAL HIGH (ref 0.0–2.0)
Bicarbonate: 34.8 mmol/L — ABNORMAL HIGH (ref 20.0–28.0)
FIO2: 50
O2 Saturation: 83.1 %
Patient temperature: 37.1
pCO2 arterial: 69.2 mmHg (ref 32.0–48.0)
pH, Arterial: 7.323 — ABNORMAL LOW (ref 7.350–7.450)
pO2, Arterial: 54.6 mmHg — ABNORMAL LOW (ref 83.0–108.0)

## 2021-09-24 LAB — BASIC METABOLIC PANEL
Anion gap: 7 (ref 5–15)
BUN: 26 mg/dL — ABNORMAL HIGH (ref 8–23)
CO2: 32 mmol/L (ref 22–32)
Calcium: 8 mg/dL — ABNORMAL LOW (ref 8.9–10.3)
Chloride: 95 mmol/L — ABNORMAL LOW (ref 98–111)
Creatinine, Ser: 0.33 mg/dL — ABNORMAL LOW (ref 0.44–1.00)
GFR, Estimated: >60 mL/min (ref >60)
Glucose, Bld: 206 mg/dL — ABNORMAL HIGH (ref 70–99)
Potassium: 4.5 mmol/L (ref 3.5–5.1)
Sodium: 134 mmol/L — ABNORMAL LOW (ref 135–145)

## 2021-09-24 LAB — VANCOMYCIN, TROUGH: Vancomycin Tr: 8 ug/mL — ABNORMAL LOW (ref 15–20)

## 2021-09-24 LAB — MAGNESIUM: Magnesium: 1.9 mg/dL (ref 1.7–2.4)

## 2021-09-24 NOTE — Progress Notes (Signed)
Pulmonary Critical Care Medicine [facility] [place]   PULMONARY CRITICAL CARE SERVICE  PROGRESS NOTE     Theresa Villa  OEV:[id number]  DOB: [birth year]   DOA: 09/20/2021  Referring Physician: Luna Kitchens, MD  HPI: Theresa Villa is a 65 y.o. female being followed for ventilator/airway/oxygen weaning Acute on Chronic Respiratory Failure.  She remains orally intubated comfortable right now without distress at this time.  The patient family members were present in the room husband and son had a conversation with them discussed her prognosis.  Explained that she is current need a tracheostomy to be done and may also require long-term mechanical ventilation if she is not able to tolerate weaning. Was also asking about possibility of home training explained to them that while this is a possibility I think we need to wait and see how she does once a tracheostomy is done.  However I did explain to them that because of the degree of pulmonary damage and severe lung disease her chances of being completely liberated from the ventilator are fairly low.  Medications: Reviewed on Rounds  Physical Exam:  Vitals: Temperature is 98.3 pulse 97 respiratory rate is 26 blood pressure 121/59 saturations.  92%  Ventilator Settings patient is now on pressure control FiO2 of 50% IP 28 PEEP 5  General: Comfortable at this time Neck: supple Cardiovascular: no malignant arrhythmias Respiratory: No rhonchi very coarse breath sounds Skin: no rash seen on limited exam Musculoskeletal: No gross abnormality Psychiatric:unable to assess Neurologic:no involuntary movements         Lab Data:   Basic Metabolic Panel: Recent Labs  Lab 09/21/21 0531 09/22/21 0453 09/24/21 0532  NA 135 133* 134*  K 3.2* 4.3 4.5  CL 97* 96* 95*  CO2 30 27 32  GLUCOSE 106* 271* 206*  BUN 32* 28* 26*  CREATININE <0.30* 0.35* 0.33*  CALCIUM 8.5* 8.3* 8.0*  MG  --  1.8 1.9    ABG: Recent Labs  Lab  09/20/21 2105  PHART 7.426  PCO2ART 44.9  PO2ART 70.0*  HCO3 29.0*  O2SAT 93.8    Liver Function Tests: Recent Labs  Lab 09/21/21 0531  AST 15  ALT 19  ALKPHOS 77  BILITOT 0.9  PROT 4.9*  ALBUMIN 2.4*   No results for input(s): LIPASE, AMYLASE in the last 168 hours. No results for input(s): AMMONIA in the last 168 hours.  CBC: Recent Labs  Lab 09/21/21 0531 09/22/21 0453 09/24/21 0532  WBC 20.3* 20.2* 20.6*  HGB 10.6* 10.1* 10.6*  HCT 34.1* 34.1* 35.2*  MCV 74.1* 76.6* 76.9*  PLT 148* 128* 190    Cardiac Enzymes: No results for input(s): CKTOTAL, CKMB, CKMBINDEX, TROPONINI in the last 168 hours.  BNP (last 3 results) No results for input(s): BNP in the last 8760 hours.  ProBNP (last 3 results) No results for input(s): PROBNP in the last 8760 hours.  Radiological Exams: No results found.  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  Respiratory therapy will check the RSB I daily we will continue to monitor closely.  Patient is orally intubated on the mechanical ventilation and will need tracheostomy to be done Left lung cavity patient essentially has a destroyed lung on  the left side prognosis remains guarded as far as being able to wean right now orally intubated will need tracheostomy Healthcare associated pneumonia treated we will continue to follow along closely MAI severe destructive disease infectious disease to see the patient Very severe COPD prognosis remains guarded   I have personally seen and evaluated the patient, evaluated laboratory and imaging results, formulated the assessment and plan and placed orders. The Patient requires high  complexity decision making with multiple systems involvement.  Rounds were done with the Respiratory Therapy Director and Staff therapists and discussed with nursing staff also.  Yevonne Pax, MD [facility] Pulmonary Critical Care Medicine Sleep Medicine

## 2021-09-25 DIAGNOSIS — J984 Other disorders of lung: Secondary | ICD-10-CM | POA: Diagnosis not present

## 2021-09-25 DIAGNOSIS — J9621 Acute and chronic respiratory failure with hypoxia: Secondary | ICD-10-CM | POA: Diagnosis not present

## 2021-09-25 DIAGNOSIS — J189 Pneumonia, unspecified organism: Secondary | ICD-10-CM | POA: Diagnosis not present

## 2021-09-25 DIAGNOSIS — J449 Chronic obstructive pulmonary disease, unspecified: Secondary | ICD-10-CM | POA: Diagnosis not present

## 2021-09-25 NOTE — Consult Note (Signed)
Infectious Disease Consultation   Theresa Villa  BCW:[id number]  DOB: 02-Dec-1955  DOA: 09/20/2021  Requesting physician: Dr. Owens Shark  Reason for consultation: Antibiotic Recommendations   History of Present Illness: Theresa Villa is an 65 y.o. female with medical history significant of anxiety, fibromyalgia, dyslipidemia, Raynaud's disease, lupus, COPD, lung cancer, cavitary lung lesions with MAC, hypertension who presented to hospital in [facility] for sepsis secondary to Mycobacterium AVM infection, worsening respiratory failure.  Patient was treated with multiple antibiotics including vancomycin, cefepime, micafungin.  She was also treated with steroids.  However, was intubated for acute respiratory failure with hypoxemia.  Patient apparently was being followed by a pulmonologist in [facility] for her [place].  She was seen in the infectious disease clinic in January 2022.  She had 3 of 3 sputum cultures with Mycobacterium avium complex with resistance to multiple antibiotics including ciprofloxacin, moxifloxacin, rifampin, doxycycline, minocycline, linezolid, Bactrim, intermediate to clarithromycin.  It was susceptible to amikacin.  At that time due to the multidrug resistance and uncertainty of cure, it was decided not to treat.  She was stabilized and transferred to [facility] for further management.  At the time of presentation she was orally intubated on 70% FiO2 and PEEP of 10.  Review of Systems:  Unable to obtain review of systems at this time.  Patient intubated.  Past Medical History: Severe COPD, fibromyalgia, dyslipidemia, Raynaud's, lupus, hypertension, MAI infection  Past Surgical History: No past surgical history on file.   Allergies:   Allergies  Allergen Reactions   Ciprofloxacin Other (See Comments)    Severe hypotension   Codeine Itching   Fosamax [Alendronate Sodium] Other (See Comments)    Numbness, lips were numb,  stomach problems   Sulfa Antibiotics Nausea And Vomiting   Ivp Dye [Iodinated Diagnostic Agents] Rash     Social History:  reports that she has been smoking cigarettes. She has a 45.00 pack-year smoking history. She has never used smokeless tobacco. She reports that she does not currently use alcohol. She reports that she does not use drugs.   Family History: Family History  Problem Relation Age of Onset   Lung cancer Mother        smoked   Colon cancer Father    Emphysema Sister        twin sister, smoker    Physical Exam: Vitals: Temperature 96.8, pulse 96, respiratory 26, blood pressure 119/61, oxygen saturation 96% on the vent  Constitutional: Thin, frail, chronically ill-appearing female, orally intubated Head: Atraumatic, normocephalic Eyes: PERLA, EOMI  ENMT: Orally intubated, no ear or nose lesions Neck: Supple CVS: S1-S2 Respiratory: Rhonchi, coarse breath sounds Abdomen: soft nontender, nondistended, normal bowel sounds  Musculoskeletal: No edema Neuro: Awake, has debility with severe generalized weakness Psych: Stable Skin: no rashes   Data reviewed:  I have personally reviewed following labs and imaging studies Labs:  CBC: Recent Labs  Lab 09/21/21 0531 09/22/21 0453 09/24/21 0532  WBC 20.3* 20.2* 20.6*  HGB 10.6* 10.1* 10.6*  HCT 34.1* 34.1* 35.2*  MCV 74.1* 76.6* 76.9*  PLT 148* 128* 038    Basic Metabolic Panel: Recent Labs  Lab 09/21/21 0531 09/22/21 0453 09/24/21 0532  NA 135 133* 134*  K 3.2* 4.3 4.5  CL 97* 96* 95*  CO2 30 27 32  GLUCOSE 106* 271* 206*  BUN 32* 28* 26*  CREATININE <0.30* 0.35* 0.33*  CALCIUM 8.5* 8.3* 8.0*  MG  --  1.8 1.9   GFR CrCl cannot be calculated (Unknown ideal weight.). Liver Function Tests: Recent Labs  Lab 09/21/21 0531  AST 15  ALT 19  ALKPHOS 77  BILITOT 0.9  PROT 4.9*  ALBUMIN 2.4*   No results for input(s): LIPASE, AMYLASE in the last 168 hours. No results for input(s): AMMONIA in the  last 168 hours. Coagulation profile No results for input(s): INR, PROTIME in the last 168 hours.  Cardiac Enzymes: No results for input(s): CKTOTAL, CKMB, CKMBINDEX, TROPONINI in the last 168 hours. BNP: Invalid input(s): POCBNP CBG: No results for input(s): GLUCAP in the last 168 hours. D-Dimer No results for input(s): DDIMER in the last 72 hours. Hgb A1c No results for input(s): HGBA1C in the last 72 hours. Lipid Profile No results for input(s): CHOL, HDL, LDLCALC, TRIG, CHOLHDL, LDLDIRECT in the last 72 hours. Thyroid function studies No results for input(s): TSH, T4TOTAL, T3FREE, THYROIDAB in the last 72 hours.  Invalid input(s): FREET3 Anemia work up No results for input(s): VITAMINB12, FOLATE, FERRITIN, TIBC, IRON, RETICCTPCT in the last 72 hours. Urinalysis No results found for: COLORURINE, APPEARANCEUR, LABSPEC, [place], GLUCOSEU, HGBUR, BILIRUBINUR, KETONESUR, PROTEINUR, UROBILINOGEN, NITRITE, LEUKOCYTESUR   Sepsis Labs Invalid input(s): PROCALCITONIN,  WBC,  LACTICIDVEN Microbiology No results found for this or any previous visit (from the past 240 hour(s)).   Inpatient Medications:   Please see MAR   Radiological Exams on Admission: No results found.  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  She was diagnosed with MAI infection a few years ago on respiratory cultures. Unfortunately the Mycobacterium is resistant to multiple antibiotics.  Patient also has allergy to Cipro, and sulfa antibiotics.  It is susceptible to amikacin but monotherapy  with a single antibiotic is unlikely to cure this.  Also the treatment regimen would be quite lengthy more than 18 months with uncertain cure.  She is also high risk for renal failure with amikacin.  Because of the resistance pattern and her allergies and underlying severe COPD unfortunately will be very challenging to treat this and despite uncertain cure despite treatment.  She was seen by infectious disease at [facility] January 2022 and they also mention that this would be very difficult to treat and also uncertain cure even with the treatment.  After discussing the risk versus benefits with the family regarding amikacin they decided to not initiate her on amikacin.  Patient at the acute facility was also found to have cavitating mass in the left upper lobe of the lung. Currently on IV vancomycin, cefepime, Flagyl for treatment of secondary bacterial pneumonia with a plan to treat for duration of 1 week pending improvement.  Please monitor CBC, BUN/creatinine closely while on antibiotics.  Avoid aggressive diuresis especially while on the IV vancomycin to prevent renal failure.  Monitor Vanco levels closely. Pulmonary following.  Further management per pulmonary and the primary team.  If her respiratory status worsens recommend to repeat chest imaging preferably chest CT which can be done without contrast if concern for renal compromise.  Also would recommend to send for repeat respiratory cultures.   Severe COPD: She has a history of severe COPD.  Continue medications and management per the primary team.  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  Currently on steroids here.  Further management per the primary team.  Severe protein calorie malnutrition/dysphagia: Management of PCM per the primary team.  Due to her dysphagia she is very high risk for aspiration and recurrent aspiration pneumonia despite being on antibiotics. Unfortunately due to her complex medical problems she is very  high risk for worsening and decompensation.  Overall poor prognosis. Plan of care discussed with the patient, primary team and pharmacy.  Thank you for this consultation.    Yaakov Guthrie M.D. 09/25/2021, 8:22 PM

## 2021-09-25 NOTE — Progress Notes (Signed)
Pulmonary Critical Care Medicine [facility] [place]   PULMONARY CRITICAL CARE SERVICE  PROGRESS NOTE     Theresa Villa  GUY:[id number]  DOB: 07/27/1956   DOA: 09/20/2021  Referring Physician: Luna Kitchens, MD  HPI: Theresa Villa is a 65 y.o. female being followed for ventilator/airway/oxygen weaning Acute on Chronic Respiratory Failure.  No fevers noted patient had a blood gas done which showed increased acidosis increased PCO2  Medications: Reviewed on Rounds  Physical Exam:  Vitals: Temperature is 96.8 pulse 96 respiratory is 26 blood pressure is 119/61 saturations 96%  Ventilator Settings on pressure control mode currently on 55% IP 30 PEEP 5  General: Comfortable at this time Neck: supple Cardiovascular: no malignant arrhythmias Respiratory: Scattered rhonchi expansion is equal Skin: no rash seen on limited exam Musculoskeletal: No gross abnormality Psychiatric:unable to assess Neurologic:no involuntary movements         Lab Data:   Basic Metabolic Panel: Recent Labs  Lab 09/21/21 0531 09/22/21 0453 09/24/21 0532  NA 135 133* 134*  K 3.2* 4.3 4.5  CL 97* 96* 95*  CO2 30 27 32  GLUCOSE 106* 271* 206*  BUN 32* 28* 26*  CREATININE <0.30* 0.35* 0.33*  CALCIUM 8.5* 8.3* 8.0*  MG  --  1.8 1.9    ABG: Recent Labs  Lab 09/20/21 2105 09/24/21 0954  PHART 7.426 7.323*  PCO2ART 44.9 69.2*  PO2ART 70.0* 54.6*  HCO3 29.0* 34.8*  O2SAT 93.8 83.1    Liver Function Tests: Recent Labs  Lab 09/21/21 0531  AST 15  ALT 19  ALKPHOS 77  BILITOT 0.9  PROT 4.9*  ALBUMIN 2.4*   No results for input(s): LIPASE, AMYLASE in the last 168 hours. No results for input(s): AMMONIA in the last 168 hours.  CBC: Recent Labs  Lab 09/21/21 0531 09/22/21 0453 09/24/21 0532  WBC 20.3* 20.2* 20.6*  HGB 10.6* 10.1* 10.6*  HCT 34.1* 34.1* 35.2*  MCV 74.1* 76.6* 76.9*  PLT 148* 128* 190    Cardiac Enzymes: No results for input(s): CKTOTAL, CKMB,  CKMBINDEX, TROPONINI in the last 168 hours.  BNP (last 3 results) No results for input(s): BNP in the last 8760 hours.  ProBNP (last 3 results) No results for input(s): PROBNP in the last 8760 hours.  Radiological Exams: No results found.  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  Patient's volumes were also increased from yesterday switched over to volume-cycled ventilation and will follow up on ABG Cavitating left lung destruction secondary to MAI likely Severe COPD medical management nebulizers as needed Healthcare associated pneumonia treated we will continue to follow along MAI infectious disease to see the patient   I have personally seen and evaluated the patient, evaluated laboratory and imaging results, formulated the assessment and plan and placed orders. The Patient requires high complexity decision making with multiple systems involvement.  Rounds were done with the Respiratory Therapy Director and Staff therapists and discussed with nursing staff also.  Yevonne Pax, MD [facility] Pulmonary Critical Care Medicine Sleep Medicine

## 2021-09-26 LAB — BASIC METABOLIC PANEL
Anion gap: 6 (ref 5–15)
BUN: 32 mg/dL — ABNORMAL HIGH (ref 8–23)
CO2: 31 mmol/L (ref 22–32)
Calcium: 7.9 mg/dL — ABNORMAL LOW (ref 8.9–10.3)
Chloride: 94 mmol/L — ABNORMAL LOW (ref 98–111)
Creatinine, Ser: 0.34 mg/dL — ABNORMAL LOW (ref 0.44–1.00)
GFR, Estimated: >60 mL/min (ref >60)
Glucose, Bld: 331 mg/dL — ABNORMAL HIGH (ref 70–99)
Potassium: 4.3 mmol/L (ref 3.5–5.1)
Sodium: 131 mmol/L — ABNORMAL LOW (ref 135–145)

## 2021-09-26 LAB — MAGNESIUM: Magnesium: 1.7 mg/dL (ref 1.7–2.4)

## 2021-09-26 LAB — CBC
HCT: 32.1 % — ABNORMAL LOW (ref 36.0–46.0)
Hemoglobin: 9.6 g/dL — ABNORMAL LOW (ref 12.0–15.0)
MCH: 23.2 pg — ABNORMAL LOW (ref 26.0–34.0)
MCHC: 29.9 g/dL — ABNORMAL LOW (ref 30.0–36.0)
MCV: 77.5 fL — ABNORMAL LOW (ref 80.0–100.0)
Platelets: 191 10*3/uL (ref 150–400)
RBC: 4.14 MIL/uL (ref 3.87–5.11)
RDW: 20.1 % — ABNORMAL HIGH (ref 11.5–15.5)
WBC: 26.8 10*3/uL — ABNORMAL HIGH (ref 4.0–10.5)
nRBC: 0 % (ref 0.0–0.2)

## 2021-09-27 DIAGNOSIS — J189 Pneumonia, unspecified organism: Secondary | ICD-10-CM | POA: Diagnosis not present

## 2021-09-27 DIAGNOSIS — J9621 Acute and chronic respiratory failure with hypoxia: Secondary | ICD-10-CM | POA: Diagnosis not present

## 2021-09-27 DIAGNOSIS — J984 Other disorders of lung: Secondary | ICD-10-CM | POA: Diagnosis not present

## 2021-09-27 DIAGNOSIS — J449 Chronic obstructive pulmonary disease, unspecified: Secondary | ICD-10-CM | POA: Diagnosis not present

## 2021-09-27 LAB — MAGNESIUM: Magnesium: 2 mg/dL (ref 1.7–2.4)

## 2021-09-27 NOTE — Progress Notes (Signed)
Pulmonary Critical Care Medicine [facility] [place]   PULMONARY CRITICAL CARE SERVICE  PROGRESS NOTE     Theresa Villa  [id number]  DOB: 12-05-1955   DOA: 09/20/2021  Referring Physician: Luna Kitchens, MD  HPI: Theresa Villa is a 65 y.o. female being followed for ventilator/airway/oxygen weaning Acute on Chronic Respiratory Failure.  Patient remains on the ventilator has not had good tolerance of weaning attempts.  Patient right now is on 55% FiO2  Medications: Reviewed on Rounds  Physical Exam:  Vitals: Temperature is 96 pulse 85 respiratory rate is 28 blood pressure is 127/71 saturations 95%  Ventilator Settings on assist control FiO2 55% PEEP 5 tidal volume 350  General: Comfortable at this time Neck: supple Cardiovascular: no malignant arrhythmias Respiratory: Coarse rhonchi expansion is equal Skin: no rash seen on limited exam Musculoskeletal: No gross abnormality Psychiatric:unable to assess Neurologic:no involuntary movements         Lab Data:   Basic Metabolic Panel: Recent Labs  Lab 09/21/21 0531 09/22/21 0453 09/24/21 0532 09/26/21 1125  NA 135 133* 134* 131*  K 3.2* 4.3 4.5 4.3  CL 97* 96* 95* 94*  CO2 30 27 32 31  GLUCOSE 106* 271* 206* 331*  BUN 32* 28* 26* 32*  CREATININE <0.30* 0.35* 0.33* 0.34*  CALCIUM 8.5* 8.3* 8.0* 7.9*  MG  --  1.8 1.9 1.7    ABG: Recent Labs  Lab 09/20/21 2105 09/24/21 0954  PHART 7.426 7.323*  PCO2ART 44.9 69.2*  PO2ART 70.0* 54.6*  HCO3 29.0* 34.8*  O2SAT 93.8 83.1    Liver Function Tests: Recent Labs  Lab 09/21/21 0531  AST 15  ALT 19  ALKPHOS 77  BILITOT 0.9  PROT 4.9*  ALBUMIN 2.4*   No results for input(s): LIPASE, AMYLASE in the last 168 hours. No results for input(s): AMMONIA in the last 168 hours.  CBC: Recent Labs  Lab 09/21/21 0531 09/22/21 0453 09/24/21 0532 09/26/21 1125  WBC 20.3* 20.2* 20.6* 26.8*  HGB 10.6* 10.1* 10.6* 9.6*  HCT 34.1* 34.1* 35.2* 32.1*   MCV 74.1* 76.6* 76.9* 77.5*  PLT 148* 128* 190 191    Cardiac Enzymes: No results for input(s): CKTOTAL, CKMB, CKMBINDEX, TROPONINI in the last 168 hours.  BNP (last 3 results) No results for input(s): BNP in the last 8760 hours.  ProBNP (last 3 results) No results for input(s): PROBNP in the last 8760 hours.  Radiological Exams: No results found.  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   Acute on chronic respiratory failure with hypoxia (HCC)   Healthcare-associated pneumonia   MAI (mycobacterium avium-intracellulare) infection (HCC)   Acute on chronic respiratory failure with hypoxia plan is to have respiratory therapy reassess once again readiness for weaning Large cavitating lung lesion the prognosis very poor Healthcare associated pneumonia treated slow improvement MAI advanced severe destructive disease Severe COPD prognosis is poor   I have personally seen and evaluated the patient, evaluated laboratory and imaging results, formulated the assessment and plan and placed orders. The Patient requires high complexity decision making with multiple systems involvement.  Rounds were done with the Respiratory Therapy Director and Staff therapists and discussed with nursing staff also.  Yevonne Pax, MD [facility] Pulmonary Critical Care Medicine Sleep Medicine

## 2021-09-28 DIAGNOSIS — J9621 Acute and chronic respiratory failure with hypoxia: Secondary | ICD-10-CM | POA: Diagnosis not present

## 2021-09-28 DIAGNOSIS — J984 Other disorders of lung: Secondary | ICD-10-CM | POA: Diagnosis not present

## 2021-09-28 DIAGNOSIS — J449 Chronic obstructive pulmonary disease, unspecified: Secondary | ICD-10-CM | POA: Diagnosis not present

## 2021-09-28 DIAGNOSIS — J189 Pneumonia, unspecified organism: Secondary | ICD-10-CM | POA: Diagnosis not present

## 2021-09-28 LAB — BASIC METABOLIC PANEL
Anion gap: 7 (ref 5–15)
BUN: 35 mg/dL — ABNORMAL HIGH (ref 8–23)
CO2: 33 mmol/L — ABNORMAL HIGH (ref 22–32)
Calcium: 8.3 mg/dL — ABNORMAL LOW (ref 8.9–10.3)
Chloride: 91 mmol/L — ABNORMAL LOW (ref 98–111)
Creatinine, Ser: <0.3 mg/dL — ABNORMAL LOW (ref 0.44–1.00)
Glucose, Bld: 189 mg/dL — ABNORMAL HIGH (ref 70–99)
Potassium: 4.8 mmol/L (ref 3.5–5.1)
Sodium: 131 mmol/L — ABNORMAL LOW (ref 135–145)

## 2021-09-28 LAB — CBC
HCT: 32.6 % — ABNORMAL LOW (ref 36.0–46.0)
Hemoglobin: 9.3 g/dL — ABNORMAL LOW (ref 12.0–15.0)
MCH: 22.5 pg — ABNORMAL LOW (ref 26.0–34.0)
MCHC: 28.5 g/dL — ABNORMAL LOW (ref 30.0–36.0)
MCV: 78.7 fL — ABNORMAL LOW (ref 80.0–100.0)
Platelets: 180 10*3/uL (ref 150–400)
RBC: 4.14 MIL/uL (ref 3.87–5.11)
RDW: 19.6 % — ABNORMAL HIGH (ref 11.5–15.5)
WBC: 19.8 10*3/uL — ABNORMAL HIGH (ref 4.0–10.5)
nRBC: 0 % (ref 0.0–0.2)

## 2021-09-28 NOTE — Progress Notes (Signed)
Pulmonary [place]   PULMONARY CRITICAL CARE SERVICE  PROGRESS NOTE     Theresa Villa  [id number]  DOB: 03-16-1956   DOA: 09/20/2021  Referring Physician: Satira Sark, MD  HPI: Athira Janowicz is a 65 y.o. female being followed for ventilator/airway/oxygen weaning Acute on Chronic Respiratory Failure.  Patient is comfortable right now without distress at this time has been afebrile.  Yesterday she was supposed to have tracheostomy however the family asked to hold off on the tracheostomy I did talk to the husband again today and explained to him that the tracheostomy is necessary to continue along the pathway of weaning her.  I had spoken on the 11th of this month with the family also and explained that tracheostomy would be needed to continue on long-term ventilation.  For what ever reason they decided to hold off on doing that and I told him that he should discuss it with his sons and then get back to [place] as to what they would like [place] to do  Medications: Reviewed on Rounds  Physical Exam:  Vitals: Temperature is 97.5 pulse 89 respiratory rate is 27 blood pressure is 111/54 saturations 97%  Ventilator Settings on assist control FiO2 50% tidal volume 350 PEEP 5  General: Comfortable at this time Neck: supple Cardiovascular: no malignant arrhythmias Respiratory: Scattered rhonchi expansion is equal Skin: no rash seen on limited exam Musculoskeletal: No gross abnormality Psychiatric:unable to assess Neurologic:no involuntary movements         Lab Data:   Basic Metabolic Panel: Recent Labs  Lab 09/22/21 0453 09/24/21 0532 09/26/21 1125 09/27/21 1136 09/28/21 0405  NA 133* 134* 131*  --  131*  K 4.3 4.5 4.3  --  4.8  CL 96* 95* 94*  --  91*  CO2 27 32 31  --  33*  GLUCOSE 271* 206* 331*  --  189*  BUN 28* 26* 32*  --  35*  CREATININE 0.35* 0.33* 0.34*  --  <0.30*  CALCIUM 8.3* 8.0* 7.9*  --  8.3*  MG 1.8 1.9 1.7 2.0   --     ABG: Recent Labs  Lab 09/24/21 0954  PHART 7.323*  PCO2ART 69.2*  PO2ART 54.6*  HCO3 34.8*  O2SAT 83.1    Liver Function Tests: No results for input(s): AST, ALT, ALKPHOS, BILITOT, PROT, ALBUMIN in the last 168 hours. No results for input(s): LIPASE, AMYLASE in the last 168 hours. No results for input(s): AMMONIA in the last 168 hours.  CBC: Recent Labs  Lab 09/22/21 0453 09/24/21 0532 09/26/21 1125 09/28/21 0405  WBC 20.2* 20.6* 26.8* 19.8*  HGB 10.1* 10.6* 9.6* 9.3*  HCT 34.1* 35.2* 32.1* 32.6*  MCV 76.6* 76.9* 77.5* 78.7*  PLT 128* 190 191 180    Cardiac Enzymes: No results for input(s): CKTOTAL, CKMB, CKMBINDEX, TROPONINI in the last 168 hours.  BNP (last 3 results) No results for input(s): BNP in the last 8760 hours.  ProBNP (last 3 results) No results for input(s): PROBNP in the last 8760 hours.  Radiological Exams: No results found.  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  Respiratory we will continue to assess the RSB I.  The patient needs a tracheostomy as already discussed with the family extensively the husband has put that off for now and once again I think this would be the best option if they want [place] to continue with all aggressive measures.  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 The Patient requires high complexity decision making with multiple systems involvement.  Rounds were done with the Respiratory Therapy Director and Staff therapists and discussed with nursing staff also.  Allyne Gee, MD [facility] Pulmonary Critical Care Medicine Sleep Medicine

## 2021-09-29 DIAGNOSIS — J449 Chronic obstructive pulmonary disease, unspecified: Secondary | ICD-10-CM | POA: Diagnosis not present

## 2021-09-29 DIAGNOSIS — J9621 Acute and chronic respiratory failure with hypoxia: Secondary | ICD-10-CM | POA: Diagnosis not present

## 2021-09-29 DIAGNOSIS — J984 Other disorders of lung: Secondary | ICD-10-CM | POA: Diagnosis not present

## 2021-09-29 DIAGNOSIS — J189 Pneumonia, unspecified organism: Secondary | ICD-10-CM | POA: Diagnosis not present

## 2021-09-29 NOTE — Progress Notes (Signed)
Pulmonary Critical Care Medicine [facility] [place]   PULMONARY CRITICAL CARE SERVICE  PROGRESS NOTE     Theresa Villa  ZOX:[id number]  DOB: Aug 31, 1956   DOA: 09/20/2021  Referring Physician: Luna Kitchens, MD  HPI: Theresa Villa is a 65 y.o. female being followed for ventilator/airway/oxygen weaning Acute on Chronic Respiratory Failure.  She is on the ventilator sleeping this morning.  Patient's sons were in the room had a conversation with them and now they are in agreement with getting a tracheostomy done.  They state that they did some research online and they agree with proceeding with the procedure.  Medications: Reviewed on Rounds  Physical Exam:  Vitals: Temperature is 96.8 pulse 81 respiratory 27 blood pressure is 147/73 saturations 93%  Ventilator Settings assist-control FiO2 40% tidal volume 350 PEEP foot  General: Comfortable at this time Neck: supple Cardiovascular: no malignant arrhythmias Respiratory: Scattered rhonchi very coarse breath sounds Skin: no rash seen on limited exam Musculoskeletal: No gross abnormality Psychiatric:unable to assess Neurologic:no involuntary movements         Lab Data:   Basic Metabolic Panel: Recent Labs  Lab 09/24/21 0532 09/26/21 1125 09/27/21 1136 09/28/21 0405  NA 134* 131*  --  131*  K 4.5 4.3  --  4.8  CL 95* 94*  --  91*  CO2 32 31  --  33*  GLUCOSE 206* 331*  --  189*  BUN 26* 32*  --  35*  CREATININE 0.33* 0.34*  --  <0.30*  CALCIUM 8.0* 7.9*  --  8.3*  MG 1.9 1.7 2.0  --     ABG: Recent Labs  Lab 09/24/21 0954  PHART 7.323*  PCO2ART 69.2*  PO2ART 54.6*  HCO3 34.8*  O2SAT 83.1    Liver Function Tests: No results for input(s): AST, ALT, ALKPHOS, BILITOT, PROT, ALBUMIN in the last 168 hours. No results for input(s): LIPASE, AMYLASE in the last 168 hours. No results for input(s): AMMONIA in the last 168 hours.  CBC: Recent Labs  Lab 09/24/21 0532 09/26/21 1125 09/28/21 0405   WBC 20.6* 26.8* 19.8*  HGB 10.6* 9.6* 9.3*  HCT 35.2* 32.1* 32.6*  MCV 76.9* 77.5* 78.7*  PLT 190 191 180    Cardiac Enzymes: No results for input(s): CKTOTAL, CKMB, CKMBINDEX, TROPONINI in the last 168 hours.  BNP (last 3 results) No results for input(s): BNP in the last 8760 hours.  ProBNP (last 3 results) No results for input(s): PROBNP in the last 8760 hours.  Radiological Exams: No results found.  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   Acute on chronic respiratory failure with hypoxia (HCC)   Healthcare-associated pneumonia   MAI (mycobacterium avium-intracellulare) infection (HCC)   Acute on chronic respiratory failure with hypoxia the family is now agreeable to doing the tracheostomy we will get this set up Severe COPD medical management continue to monitor closely prognosis is very poor MAI severe end-stage disease Healthcare associated pneumonia has been treated with antibiotics Cavitating left lung prognosis guarded   I have personally seen and evaluated the patient, evaluated laboratory and imaging results, formulated the assessment and plan and placed orders. The Patient requires high complexity decision making with multiple systems involvement.  Rounds were done with the Respiratory Therapy Director and Staff therapists and discussed with nursing staff also.  Yevonne Pax, MD [facility] Pulmonary Critical Care Medicine Sleep Medicine

## 2021-09-30 DIAGNOSIS — J984 Other disorders of lung: Secondary | ICD-10-CM | POA: Diagnosis not present

## 2021-09-30 DIAGNOSIS — J189 Pneumonia, unspecified organism: Secondary | ICD-10-CM | POA: Diagnosis not present

## 2021-09-30 DIAGNOSIS — J9621 Acute and chronic respiratory failure with hypoxia: Secondary | ICD-10-CM | POA: Diagnosis not present

## 2021-09-30 DIAGNOSIS — J449 Chronic obstructive pulmonary disease, unspecified: Secondary | ICD-10-CM | POA: Diagnosis not present

## 2021-09-30 LAB — CBC
HCT: 30.8 % — ABNORMAL LOW (ref 36.0–46.0)
Hemoglobin: 9 g/dL — ABNORMAL LOW (ref 12.0–15.0)
MCH: 22.5 pg — ABNORMAL LOW (ref 26.0–34.0)
MCHC: 29.2 g/dL — ABNORMAL LOW (ref 30.0–36.0)
MCV: 77 fL — ABNORMAL LOW (ref 80.0–100.0)
Platelets: 177 10*3/uL (ref 150–400)
RBC: 4 MIL/uL (ref 3.87–5.11)
RDW: 20.2 % — ABNORMAL HIGH (ref 11.5–15.5)
WBC: 15.1 10*3/uL — ABNORMAL HIGH (ref 4.0–10.5)
nRBC: 0 % (ref 0.0–0.2)

## 2021-09-30 NOTE — Progress Notes (Signed)
Pulmonary Critical Care Medicine [facility] [place]   PULMONARY CRITICAL CARE SERVICE  PROGRESS NOTE     Theresa Villa  [id number]  DOB: 07-24-1956   DOA: 09/20/2021  Referring Physician: Luna Kitchens, MD  HPI: Theresa Villa is a 65 y.o. female being followed for ventilator/airway/oxygen weaning Acute on Chronic Respiratory Failure.  She is on the ventilator full support and assist control mode has been on 45% FiO2  Medications: Reviewed on Rounds  Physical Exam:  Vitals: Temperature is 97.5 pulse 82 respiratory 26 blood pressure is 136/72 saturations 97%  Ventilator Settings assist-control FiO2 45% PEEP 5 tidal volume 350  General: Comfortable at this time Neck: supple Cardiovascular: no malignant arrhythmias Respiratory: No rhonchi no rales are noted at this time Skin: no rash seen on limited exam Musculoskeletal: No gross abnormality Psychiatric:unable to assess Neurologic:no involuntary movements         Lab Data:   Basic Metabolic Panel: Recent Labs  Lab 09/24/21 0532 09/26/21 1125 09/27/21 1136 09/28/21 0405  NA 134* 131*  --  131*  K 4.5 4.3  --  4.8  CL 95* 94*  --  91*  CO2 32 31  --  33*  GLUCOSE 206* 331*  --  189*  BUN 26* 32*  --  35*  CREATININE 0.33* 0.34*  --  <0.30*  CALCIUM 8.0* 7.9*  --  8.3*  MG 1.9 1.7 2.0  --     ABG: Recent Labs  Lab 09/24/21 0954  PHART 7.323*  PCO2ART 69.2*  PO2ART 54.6*  HCO3 34.8*  O2SAT 83.1    Liver Function Tests: No results for input(s): AST, ALT, ALKPHOS, BILITOT, PROT, ALBUMIN in the last 168 hours. No results for input(s): LIPASE, AMYLASE in the last 168 hours. No results for input(s): AMMONIA in the last 168 hours.  CBC: Recent Labs  Lab 09/24/21 0532 09/26/21 1125 09/28/21 0405 09/30/21 0324  WBC 20.6* 26.8* 19.8* 15.1*  HGB 10.6* 9.6* 9.3* 9.0*  HCT 35.2* 32.1* 32.6* 30.8*  MCV 76.9* 77.5* 78.7* 77.0*  PLT 190 191 180 177    Cardiac Enzymes: No results for  input(s): CKTOTAL, CKMB, CKMBINDEX, TROPONINI in the last 168 hours.  BNP (last 3 results) No results for input(s): BNP in the last 8760 hours.  ProBNP (last 3 results) No results for input(s): PROBNP in the last 8760 hours.  Radiological Exams: No results found.  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 The Patient requires high complexity decision making with multiple systems involvement.  Rounds were done with the Respiratory Therapy Director and Staff therapists and discussed with nursing staff also.  Yevonne Pax, MD [facility] Pulmonary Critical Care Medicine Sleep Medicine

## 2021-10-01 DIAGNOSIS — J9621 Acute and chronic respiratory failure with hypoxia: Secondary | ICD-10-CM | POA: Diagnosis not present

## 2021-10-01 DIAGNOSIS — J189 Pneumonia, unspecified organism: Secondary | ICD-10-CM | POA: Diagnosis not present

## 2021-10-01 DIAGNOSIS — J449 Chronic obstructive pulmonary disease, unspecified: Secondary | ICD-10-CM | POA: Diagnosis not present

## 2021-10-01 DIAGNOSIS — J984 Other disorders of lung: Secondary | ICD-10-CM | POA: Diagnosis not present

## 2021-10-01 NOTE — Progress Notes (Signed)
Pulmonary Critical Care Medicine [facility] [place]   PULMONARY CRITICAL CARE SERVICE  PROGRESS NOTE     Theresa Villa  [id number]  DOB: 02/02/1956   DOA: 09/20/2021  Referring Physician: Luna Kitchens, MD  HPI: Theresa Villa is a 65 y.o. female being followed for ventilator/airway/oxygen weaning Acute on Chronic Respiratory Failure.  Patient is on the ventilator and full support.  She remains intubated and on assist control mode  Medications: Reviewed on Rounds  Physical Exam:  Vitals: Temperature is 97.0 pulse 78 respiratory rate is 22 blood pressure is 112/62 saturations 98%  Ventilator Settings on assist control FiO2 is 50% tidal volume 350  General: Comfortable at this time Neck: supple Cardiovascular: no malignant arrhythmias Respiratory: No rhonchi very coarse breath sounds Skin: no rash seen on limited exam Musculoskeletal: No gross abnormality Psychiatric:unable to assess Neurologic:no involuntary movements         Lab Data:   Basic Metabolic Panel: Recent Labs  Lab 09/26/21 1125 09/27/21 1136 09/28/21 0405  NA 131*  --  131*  K 4.3  --  4.8  CL 94*  --  91*  CO2 31  --  33*  GLUCOSE 331*  --  189*  BUN 32*  --  35*  CREATININE 0.34*  --  <0.30*  CALCIUM 7.9*  --  8.3*  MG 1.7 2.0  --     ABG: Recent Labs  Lab 09/24/21 0954  PHART 7.323*  PCO2ART 69.2*  PO2ART 54.6*  HCO3 34.8*  O2SAT 83.1    Liver Function Tests: No results for input(s): AST, ALT, ALKPHOS, BILITOT, PROT, ALBUMIN in the last 168 hours. No results for input(s): LIPASE, AMYLASE in the last 168 hours. No results for input(s): AMMONIA in the last 168 hours.  CBC: Recent Labs  Lab 09/26/21 1125 09/28/21 0405 09/30/21 0324  WBC 26.8* 19.8* 15.1*  HGB 9.6* 9.3* 9.0*  HCT 32.1* 32.6* 30.8*  MCV 77.5* 78.7* 77.0*  PLT 191 180 177    Cardiac Enzymes: No results for input(s): CKTOTAL, CKMB, CKMBINDEX, TROPONINI in the last 168 hours.  BNP (last 3  results) No results for input(s): BNP in the last 8760 hours.  ProBNP (last 3 results) No results for input(s): PROBNP in the last 8760 hours.  Radiological Exams: No results found.  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   Acute on chronic respiratory failure with hypoxia (HCC)   Healthcare-associated pneumonia   MAI (mycobacterium avium-intracellulare) infection (HCC)   Acute on chronic respiratory failure with hypoxia patient is comfortable without any distress at this time on the ventilator and full support we are awaiting tracheostomy possibly next week Severe COPD medical management prognosis is poor Cavitating left lung again poor prognosis we will continue with supportive care Healthcare associated pneumonia slow improvement MAI patient is on antibiotic therapy   I have personally seen and evaluated the patient, evaluated laboratory and imaging results, formulated the assessment and plan and placed orders. The Patient requires high complexity decision making with multiple systems involvement.  Rounds were done with the Respiratory Therapy Director and Staff therapists and discussed with nursing staff also.  Yevonne Pax, MD [facility] Pulmonary Critical Care Medicine Sleep Medicine

## 2021-10-02 LAB — CBC
HCT: 30.6 % — ABNORMAL LOW (ref 36.0–46.0)
Hemoglobin: 9.1 g/dL — ABNORMAL LOW (ref 12.0–15.0)
MCH: 22.9 pg — ABNORMAL LOW (ref 26.0–34.0)
MCHC: 29.7 g/dL — ABNORMAL LOW (ref 30.0–36.0)
MCV: 76.9 fL — ABNORMAL LOW (ref 80.0–100.0)
Platelets: 176 10*3/uL (ref 150–400)
RBC: 3.98 MIL/uL (ref 3.87–5.11)
RDW: 20.6 % — ABNORMAL HIGH (ref 11.5–15.5)
WBC: 12.5 10*3/uL — ABNORMAL HIGH (ref 4.0–10.5)
nRBC: 0 % (ref 0.0–0.2)

## 2021-10-02 LAB — BASIC METABOLIC PANEL
Anion gap: 6 (ref 5–15)
BUN: 38 mg/dL — ABNORMAL HIGH (ref 8–23)
CO2: 37 mmol/L — ABNORMAL HIGH (ref 22–32)
Calcium: 8.5 mg/dL — ABNORMAL LOW (ref 8.9–10.3)
Chloride: 89 mmol/L — ABNORMAL LOW (ref 98–111)
Creatinine, Ser: <0.3 mg/dL — ABNORMAL LOW (ref 0.44–1.00)
Glucose, Bld: 146 mg/dL — ABNORMAL HIGH (ref 70–99)
Potassium: 4.8 mmol/L (ref 3.5–5.1)
Sodium: 132 mmol/L — ABNORMAL LOW (ref 135–145)

## 2021-10-04 DIAGNOSIS — J9621 Acute and chronic respiratory failure with hypoxia: Secondary | ICD-10-CM | POA: Diagnosis not present

## 2021-10-04 DIAGNOSIS — J984 Other disorders of lung: Secondary | ICD-10-CM | POA: Diagnosis not present

## 2021-10-04 DIAGNOSIS — J449 Chronic obstructive pulmonary disease, unspecified: Secondary | ICD-10-CM | POA: Diagnosis not present

## 2021-10-04 DIAGNOSIS — J189 Pneumonia, unspecified organism: Secondary | ICD-10-CM | POA: Diagnosis not present

## 2021-10-04 LAB — BASIC METABOLIC PANEL
Anion gap: 6 (ref 5–15)
BUN: 35 mg/dL — ABNORMAL HIGH (ref 8–23)
CO2: 38 mmol/L — ABNORMAL HIGH (ref 22–32)
Calcium: 8.3 mg/dL — ABNORMAL LOW (ref 8.9–10.3)
Chloride: 89 mmol/L — ABNORMAL LOW (ref 98–111)
Creatinine, Ser: <0.3 mg/dL — ABNORMAL LOW (ref 0.44–1.00)
Glucose, Bld: 159 mg/dL — ABNORMAL HIGH (ref 70–99)
Potassium: 4.5 mmol/L (ref 3.5–5.1)
Sodium: 133 mmol/L — ABNORMAL LOW (ref 135–145)

## 2021-10-04 LAB — CBC
HCT: 31.3 % — ABNORMAL LOW (ref 36.0–46.0)
Hemoglobin: 9.1 g/dL — ABNORMAL LOW (ref 12.0–15.0)
MCH: 22.9 pg — ABNORMAL LOW (ref 26.0–34.0)
MCHC: 29.1 g/dL — ABNORMAL LOW (ref 30.0–36.0)
MCV: 78.8 fL — ABNORMAL LOW (ref 80.0–100.0)
Platelets: 59 10*3/uL — ABNORMAL LOW (ref 150–400)
RBC: 3.97 MIL/uL (ref 3.87–5.11)
RDW: 21.3 % — ABNORMAL HIGH (ref 11.5–15.5)
WBC: 11.3 10*3/uL — ABNORMAL HIGH (ref 4.0–10.5)
nRBC: 0 % (ref 0.0–0.2)

## 2021-10-04 LAB — MAGNESIUM: Magnesium: 2.1 mg/dL (ref 1.7–2.4)

## 2021-10-04 LAB — FERRITIN: Ferritin: 68 ng/mL (ref 11–307)

## 2021-10-04 LAB — VITAMIN B12: Vitamin B-12: 963 pg/mL — ABNORMAL HIGH (ref 180–914)

## 2021-10-04 NOTE — Progress Notes (Signed)
Pulmonary Critical Care Medicine [facility] [place]   PULMONARY CRITICAL CARE SERVICE  PROGRESS NOTE     Theresa Villa  [id number]  DOB: 02-13-1956   DOA: 09/20/2021  Referring Physician: Luna Kitchens, MD  HPI: Theresa Villa is a 65 y.o. female being followed for ventilator/airway/oxygen weaning Acute on Chronic Respiratory Failure.  Over the weekend patient did about 4 hours on pressure support with a higher pressure support.  She is supposed to have tracheostomy done but it looks like it might not be until next week because of scheduling  Medications: Reviewed on Rounds  Physical Exam:  Vitals: Temperature is 96.2 pulse 90 respiratory rate is 27 blood pressure is 138/77 saturations 96%  Ventilator Settings assist-control FiO2 45% tidal volumes 350 PEEP of 5  General: Comfortable at this time Neck: supple Cardiovascular: no malignant arrhythmias Respiratory: Scattered rhonchi very coarse breath sounds Skin: no rash seen on limited exam Musculoskeletal: No gross abnormality Psychiatric:unable to assess Neurologic:no involuntary movements         Lab Data:   Basic Metabolic Panel: Recent Labs  Lab 09/27/21 1136 09/28/21 0405 10/02/21 0307 10/04/21 0418  NA  --  131* 132* 133*  K  --  4.8 4.8 4.5  CL  --  91* 89* 89*  CO2  --  33* 37* 38*  GLUCOSE  --  189* 146* 159*  BUN  --  35* 38* 35*  CREATININE  --  <0.30* <0.30* <0.30*  CALCIUM  --  8.3* 8.5* 8.3*  MG 2.0  --   --  2.1    ABG: No results for input(s): PHART, PCO2ART, PO2ART, HCO3, O2SAT in the last 168 hours.  Liver Function Tests: No results for input(s): AST, ALT, ALKPHOS, BILITOT, PROT, ALBUMIN in the last 168 hours. No results for input(s): LIPASE, AMYLASE in the last 168 hours. No results for input(s): AMMONIA in the last 168 hours.  CBC: Recent Labs  Lab 09/28/21 0405 09/30/21 0324 10/02/21 0307 10/04/21 0418  WBC 19.8* 15.1* 12.5* 11.3*  HGB 9.3* 9.0* 9.1* 9.1*   HCT 32.6* 30.8* 30.6* 31.3*  MCV 78.7* 77.0* 76.9* 78.8*  PLT 180 177 176 59*    Cardiac Enzymes: No results for input(s): CKTOTAL, CKMB, CKMBINDEX, TROPONINI in the last 168 hours.  BNP (last 3 results) No results for input(s): BNP in the last 8760 hours.  ProBNP (last 3 results) No results for input(s): PROBNP in the last 8760 hours.  Radiological Exams: No results found.  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 Cavitating left lung again poor prognosis the entire lung is essentially been destroyed Healthcare associated pneumonia has been treated with antibiotics MAI infectious disease follow-up   I have personally seen and evaluated the patient, evaluated laboratory and imaging results, formulated the assessment and plan and placed orders. The Patient requires high complexity decision making with multiple systems involvement.  Rounds were done with the Respiratory Therapy Director and Staff therapists and discussed with nursing  staff also.  Allyne Gee, MD [facility] Pulmonary Critical Care Medicine Sleep Medicine

## 2021-10-05 ENCOUNTER — Other Ambulatory Visit (HOSPITAL_COMMUNITY): Payer: Medicare Other

## 2021-10-05 DIAGNOSIS — J9621 Acute and chronic respiratory failure with hypoxia: Secondary | ICD-10-CM | POA: Diagnosis not present

## 2021-10-05 DIAGNOSIS — J449 Chronic obstructive pulmonary disease, unspecified: Secondary | ICD-10-CM | POA: Diagnosis not present

## 2021-10-05 DIAGNOSIS — J189 Pneumonia, unspecified organism: Secondary | ICD-10-CM | POA: Diagnosis not present

## 2021-10-05 DIAGNOSIS — J984 Other disorders of lung: Secondary | ICD-10-CM | POA: Diagnosis not present

## 2021-10-05 LAB — URINALYSIS, ROUTINE W REFLEX MICROSCOPIC
Bacteria, UA: NONE SEEN
Bilirubin Urine: NEGATIVE
Glucose, UA: NEGATIVE mg/dL
Hgb urine dipstick: NEGATIVE
Ketones, ur: NEGATIVE mg/dL
Nitrite: NEGATIVE
Protein, ur: NEGATIVE mg/dL
Specific Gravity, Urine: 1.023 (ref 1.005–1.030)
pH: 6 (ref 5.0–8.0)

## 2021-10-05 LAB — CBC
HCT: 30.6 % — ABNORMAL LOW (ref 36.0–46.0)
Hemoglobin: 8.8 g/dL — ABNORMAL LOW (ref 12.0–15.0)
MCH: 22.8 pg — ABNORMAL LOW (ref 26.0–34.0)
MCHC: 28.8 g/dL — ABNORMAL LOW (ref 30.0–36.0)
MCV: 79.3 fL — ABNORMAL LOW (ref 80.0–100.0)
Platelets: 148 10*3/uL — ABNORMAL LOW (ref 150–400)
RBC: 3.86 MIL/uL — ABNORMAL LOW (ref 3.87–5.11)
RDW: 21.2 % — ABNORMAL HIGH (ref 11.5–15.5)
WBC: 13 10*3/uL — ABNORMAL HIGH (ref 4.0–10.5)
nRBC: 0 % (ref 0.0–0.2)

## 2021-10-05 NOTE — Progress Notes (Signed)
Pulmonary Critical Care Medicine [facility] [place]   PULMONARY CRITICAL CARE SERVICE  PROGRESS NOTE     Theresa Villa  RDE:[id number]  DOB: [birth year]   DOA: 09/20/2021  Referring Physician: Luna Kitchens, MD  HPI: Theresa Villa is a 65 y.o. female being followed for ventilator/airway/oxygen weaning Acute on Chronic Respiratory Failure.  She is awake remains orally intubated actually did pressure support for 8 hours yesterday today with trying for 12 hours  Medications: Reviewed on Rounds  Physical Exam:  Vitals: Temperature is 97.2 pulse 82 respiratory 24 blood pressure is 94/63 saturations 95%  Ventilator Settings on pressure support 12/5  General: Comfortable at this time Neck: supple Cardiovascular: no malignant arrhythmias Respiratory: Scattered rhonchi diminished breath sound Skin: no rash seen on limited exam Musculoskeletal: No gross abnormality Psychiatric:unable to assess Neurologic:no involuntary movements         Lab Data:   Basic Metabolic Panel: Recent Labs  Lab 10/02/21 0307 10/04/21 0418  NA 132* 133*  K 4.8 4.5  CL 89* 89*  CO2 37* 38*  GLUCOSE 146* 159*  BUN 38* 35*  CREATININE <0.30* <0.30*  CALCIUM 8.5* 8.3*  MG  --  2.1    ABG: No results for input(s): PHART, PCO2ART, PO2ART, HCO3, O2SAT in the last 168 hours.  Liver Function Tests: No results for input(s): AST, ALT, ALKPHOS, BILITOT, PROT, ALBUMIN in the last 168 hours. No results for input(s): LIPASE, AMYLASE in the last 168 hours. No results for input(s): AMMONIA in the last 168 hours.  CBC: Recent Labs  Lab 09/30/21 0324 10/02/21 0307 10/04/21 0418 10/05/21 0422  WBC 15.1* 12.5* 11.3* 13.0*  HGB 9.0* 9.1* 9.1* 8.8*  HCT 30.8* 30.6* 31.3* 30.6*  MCV 77.0* 76.9* 78.8* 79.3*  PLT 177 176 59* 148*    Cardiac Enzymes: No results for input(s): CKTOTAL, CKMB, CKMBINDEX, TROPONINI in the last 168 hours.  BNP (last 3 results) No results for input(s): BNP in  the last 8760 hours.  ProBNP (last 3 results) No results for input(s): PROBNP in the last 8760 hours.  Radiological Exams: No results found.  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  So far we do not have a date or time for the tracheostomy however if she continues to tolerate well may need to consider extubation trial if she continues to do well Severe COPD medical management we will continue with nebulizers inhalers. Healthcare associated pneumonia has been treated we will continue to monitor closely. Cavitating lung supportive care prognosis is poor MAI has been on no treatment previously being seen by infectious disease here   I have personally seen and evaluated the patient, evaluated laboratory and imaging results, formulated the assessment and plan and placed orders. The Patient requires high complexity decision making with multiple systems involvement.  Rounds were done with the Respiratory Therapy Director and Staff therapists and discussed with nursing staff also.  Yevonne Pax, MD [facility] Pulmonary Critical Care Medicine Sleep Medicine

## 2021-10-06 DIAGNOSIS — J189 Pneumonia, unspecified organism: Secondary | ICD-10-CM | POA: Diagnosis not present

## 2021-10-06 DIAGNOSIS — J9621 Acute and chronic respiratory failure with hypoxia: Secondary | ICD-10-CM | POA: Diagnosis not present

## 2021-10-06 DIAGNOSIS — J449 Chronic obstructive pulmonary disease, unspecified: Secondary | ICD-10-CM | POA: Diagnosis not present

## 2021-10-06 DIAGNOSIS — J984 Other disorders of lung: Secondary | ICD-10-CM | POA: Diagnosis not present

## 2021-10-06 LAB — VITAMIN B1: Vitamin B1 (Thiamine): 203 nmol/L — ABNORMAL HIGH (ref 66.5–200.0)

## 2021-10-06 LAB — URINE CULTURE: Culture: <10000 — AB

## 2021-10-06 LAB — HEPARIN INDUCED PLATELET AB (HIT ANTIBODY): Heparin Induced Plt Ab: 0.047 OD (ref 0.000–0.400)

## 2021-10-06 NOTE — Progress Notes (Signed)
Pulmonary Critical Care Medicine [facility] [place]   PULMONARY CRITICAL CARE SERVICE  PROGRESS NOTE     Theresa Villa  FIE:[id number]  DOB: 10-09-1956   DOA: 09/20/2021  Referring Physician: Luna Kitchens, MD  HPI: Theresa Villa is a 65 y.o. female being followed for ventilator/airway/oxygen weaning Acute on Chronic Respiratory Failure.  Patient is on pressure support she is actually been tolerating it well today the goal is for 16 hours.  Patient's son was in the room I spoke to him explained to him that she does seem to be doing a little bit better and with some cautious optimism may want to consider an extubation trial.  She has been treated for infection and perhaps with improvement in her infection her mechanics are also improvement.  1 thing I did also make sure he understood is that there is a chance obviously that she would fail and then she would have to be reintubated and placed back on mechanical ventilation.  If we were to give her an extubation trial she would be extubated to BiPAP  Medications: Reviewed on Rounds  Physical Exam:  Vitals: Temperature is 96.9 pulse 82 respiratory 22 blood pressure is 118/68 saturations 98%  Ventilator Settings currently on pressure support 12/5 FiO2 45%  General: Comfortable at this time Neck: supple Cardiovascular: no malignant arrhythmias Respiratory: No rhonchi very coarse breath sounds Skin: no rash seen on limited exam Musculoskeletal: No gross abnormality Psychiatric:unable to assess Neurologic:no involuntary movements         Lab Data:   Basic Metabolic Panel: Recent Labs  Lab 10/02/21 0307 10/04/21 0418  NA 132* 133*  K 4.8 4.5  CL 89* 89*  CO2 37* 38*  GLUCOSE 146* 159*  BUN 38* 35*  CREATININE <0.30* <0.30*  CALCIUM 8.5* 8.3*  MG  --  2.1    ABG: No results for input(s): PHART, PCO2ART, PO2ART, HCO3, O2SAT in the last 168 hours.  Liver Function Tests: No results for input(s): AST, ALT,  ALKPHOS, BILITOT, PROT, ALBUMIN in the last 168 hours. No results for input(s): LIPASE, AMYLASE in the last 168 hours. No results for input(s): AMMONIA in the last 168 hours.  CBC: Recent Labs  Lab 09/30/21 0324 10/02/21 0307 10/04/21 0418 10/05/21 0422  WBC 15.1* 12.5* 11.3* 13.0*  HGB 9.0* 9.1* 9.1* 8.8*  HCT 30.8* 30.6* 31.3* 30.6*  MCV 77.0* 76.9* 78.8* 79.3*  PLT 177 176 59* 148*    Cardiac Enzymes: No results for input(s): CKTOTAL, CKMB, CKMBINDEX, TROPONINI in the last 168 hours.  BNP (last 3 results) No results for input(s): BNP in the last 8760 hours.  ProBNP (last 3 results) No results for input(s): PROBNP in the last 8760 hours.  Radiological Exams: DG CHEST PORT 1 VIEW  Result Date: 10/05/2021 CLINICAL DATA:  Respiratory failure EXAM: PORTABLE CHEST 1 VIEW COMPARISON:  Chest x-ray 09/21/2021 FINDINGS: Endotracheal tube tip is 4.2 cm above the carina. Enteric tube tip is in the stomach. Heart size is normal. Mediastinum appears grossly stable. Calcified plaques in the aortic arch. Large bulla lucency in the left upper lobe. Chronic emphysematous changes and prominent interstitial lung markings. Persistent irregular reticular opacities in the left mid to lower lung zone. Increased size of a moderate left pleural effusion. No pneumothorax visualized. IMPRESSION: 1. Medical devices as described. 2. Interval enlargement of a moderate left pleural effusion with associated atelectasis/infiltrate in the left lower lung. 3. Chronic and emphysematous changes of the lungs. Electronically Signed   By: Granville Lewis  Mayford Knife M.D.   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   Acute on chronic respiratory failure with hypoxia (HCC)   Healthcare-associated pneumonia   MAI (mycobacterium avium-intracellulare) infection (HCC)   Acute on chronic respiratory failure with hypoxia the plan is going to be to  continue with pressure support 12/5 she should be doing 16 hours goal today we will continue to advance we did briefly put her on pressure support 8/5 when she was pulling tidal volumes of about 350-400 Cavitating lung mass supportive care patient prognosis is poor Severe COPD medical management Healthcare associated pneumonia treated we will continue to follow along closely. MAI infection poor prognosis   I have personally seen and evaluated the patient, evaluated laboratory and imaging results, formulated the assessment and plan and placed orders. The Patient requires high complexity decision making with multiple systems involvement.  Rounds were done with the Respiratory Therapy Director and Staff therapists and discussed with nursing staff also.  Yevonne Pax, MD [facility] Pulmonary Critical Care Medicine Sleep Medicine

## 2021-10-07 LAB — BASIC METABOLIC PANEL
Anion gap: 5 (ref 5–15)
BUN: 29 mg/dL — ABNORMAL HIGH (ref 8–23)
CO2: 39 mmol/L — ABNORMAL HIGH (ref 22–32)
Calcium: 8.3 mg/dL — ABNORMAL LOW (ref 8.9–10.3)
Chloride: 91 mmol/L — ABNORMAL LOW (ref 98–111)
Creatinine, Ser: <0.3 mg/dL — ABNORMAL LOW (ref 0.44–1.00)
Glucose, Bld: 97 mg/dL (ref 70–99)
Potassium: 4.3 mmol/L (ref 3.5–5.1)
Sodium: 135 mmol/L (ref 135–145)

## 2021-10-07 LAB — CBC
HCT: 28.7 % — ABNORMAL LOW (ref 36.0–46.0)
Hemoglobin: 8.3 g/dL — ABNORMAL LOW (ref 12.0–15.0)
MCH: 23 pg — ABNORMAL LOW (ref 26.0–34.0)
MCHC: 28.9 g/dL — ABNORMAL LOW (ref 30.0–36.0)
MCV: 79.5 fL — ABNORMAL LOW (ref 80.0–100.0)
Platelets: 149 10*3/uL — ABNORMAL LOW (ref 150–400)
RBC: 3.61 MIL/uL — ABNORMAL LOW (ref 3.87–5.11)
RDW: 21.4 % — ABNORMAL HIGH (ref 11.5–15.5)
WBC: 9.6 10*3/uL (ref 4.0–10.5)
nRBC: 0 % (ref 0.0–0.2)

## 2021-10-07 LAB — CULTURE, RESPIRATORY W GRAM STAIN

## 2021-10-09 DIAGNOSIS — J984 Other disorders of lung: Secondary | ICD-10-CM | POA: Diagnosis not present

## 2021-10-09 DIAGNOSIS — J449 Chronic obstructive pulmonary disease, unspecified: Secondary | ICD-10-CM | POA: Diagnosis not present

## 2021-10-09 DIAGNOSIS — J9621 Acute and chronic respiratory failure with hypoxia: Secondary | ICD-10-CM | POA: Diagnosis not present

## 2021-10-09 DIAGNOSIS — J189 Pneumonia, unspecified organism: Secondary | ICD-10-CM | POA: Diagnosis not present

## 2021-10-09 NOTE — Progress Notes (Signed)
Pulmonary Critical Care Medicine [facility] [place]   PULMONARY CRITICAL CARE SERVICE  PROGRESS NOTE     Theresa Villa  ZTI:[id number]  DOB: 05-11-1956   DOA: 09/20/2021  Referring Physician: Luna Kitchens, MD  HPI: Theresa Villa is a 65 y.o. female being followed for ventilator/airway/oxygen weaning Acute on Chronic Respiratory Failure.  Patient is on pressure support mode on 60% FiO2 supposed to do 24 hours on the weaning  Medications: Reviewed on Rounds  Physical Exam:  Vitals: Temperature is 96.9 pulse 78 respiratory 24 blood pressure is 138/64 saturations 97%  Ventilator Settings on pressure support FiO2 60% pressure 12/5  General: Comfortable at this time Neck: supple Cardiovascular: no malignant arrhythmias Respiratory: Scattered rhonchi expansion is equal Skin: no rash seen on limited exam Musculoskeletal: No gross abnormality Psychiatric:unable to assess Neurologic:no involuntary movements         Lab Data:   Basic Metabolic Panel: Recent Labs  Lab 10/04/21 0418 10/07/21 0303  NA 133* 135  K 4.5 4.3  CL 89* 91*  CO2 38* 39*  GLUCOSE 159* 97  BUN 35* 29*  CREATININE <0.30* <0.30*  CALCIUM 8.3* 8.3*  MG 2.1  --     ABG: No results for input(s): PHART, PCO2ART, PO2ART, HCO3, O2SAT in the last 168 hours.  Liver Function Tests: No results for input(s): AST, ALT, ALKPHOS, BILITOT, PROT, ALBUMIN in the last 168 hours. No results for input(s): LIPASE, AMYLASE in the last 168 hours. No results for input(s): AMMONIA in the last 168 hours.  CBC: Recent Labs  Lab 10/04/21 0418 10/05/21 0422 10/07/21 0303  WBC 11.3* 13.0* 9.6  HGB 9.1* 8.8* 8.3*  HCT 31.3* 30.6* 28.7*  MCV 78.8* 79.3* 79.5*  PLT 59* 148* 149*    Cardiac Enzymes: No results for input(s): CKTOTAL, CKMB, CKMBINDEX, TROPONINI in the last 168 hours.  BNP (last 3 results) No results for input(s): BNP in the last 8760 hours.  ProBNP (last 3 results) No results for  input(s): PROBNP in the last 8760 hours.  Radiological Exams: No results found.  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   Acute on chronic respiratory failure with hypoxia (HCC)   Healthcare-associated pneumonia   MAI (mycobacterium avium-intracellulare) infection (HCC)   Acute on chronic respiratory failure with hypoxia the patient is going to be continuing with trying to wean goal of the 24 hours on pressure support as previously discussed we will give her a trial of extubation if family is in agreement they seem to be in agreement at this time and also understand that if she feels she would require reintubation and tracheostomy Cavitating lung prognosis is poor we will continue to monitor Severe COPD medical management MAI therapy supportive care Healthcare associated pneumonia has been treated   I have personally seen and evaluated the patient, evaluated laboratory and imaging results, formulated the assessment and plan and placed orders. The Patient requires high complexity decision making with multiple systems involvement.  Rounds were done with the Respiratory Therapy Director and Staff therapists and discussed with nursing staff also.  Yevonne Pax, MD [facility] Pulmonary Critical Care Medicine Sleep Medicine

## 2021-10-10 ENCOUNTER — Other Ambulatory Visit (HOSPITAL_COMMUNITY): Payer: Medicare Other

## 2021-10-10 LAB — BLOOD GAS, ARTERIAL
Acid-Base Excess: 13 mmol/L — ABNORMAL HIGH (ref 0.0–2.0)
Bicarbonate: 39.7 mmol/L — ABNORMAL HIGH (ref 20.0–28.0)
Drawn by: 164
FIO2: 65
O2 Saturation: 92.6 %
Patient temperature: 37
pCO2 arterial: 82.9 mmHg (ref 32.0–48.0)
pH, Arterial: 7.302 — ABNORMAL LOW (ref 7.350–7.450)
pO2, Arterial: 72.8 mmHg — ABNORMAL LOW (ref 83.0–108.0)

## 2021-10-10 LAB — MAGNESIUM: Magnesium: 2 mg/dL (ref 1.7–2.4)

## 2021-10-10 LAB — CBC
HCT: 30 % — ABNORMAL LOW (ref 36.0–46.0)
Hemoglobin: 8.2 g/dL — ABNORMAL LOW (ref 12.0–15.0)
MCH: 22.5 pg — ABNORMAL LOW (ref 26.0–34.0)
MCHC: 27.3 g/dL — ABNORMAL LOW (ref 30.0–36.0)
MCV: 82.4 fL (ref 80.0–100.0)
Platelets: 178 10*3/uL (ref 150–400)
RBC: 3.64 MIL/uL — ABNORMAL LOW (ref 3.87–5.11)
RDW: 20.7 % — ABNORMAL HIGH (ref 11.5–15.5)
WBC: 9.4 10*3/uL (ref 4.0–10.5)
nRBC: 0 % (ref 0.0–0.2)

## 2021-10-10 LAB — BASIC METABOLIC PANEL
Anion gap: 5 (ref 5–15)
BUN: 25 mg/dL — ABNORMAL HIGH (ref 8–23)
CO2: 40 mmol/L — ABNORMAL HIGH (ref 22–32)
Calcium: 8.3 mg/dL — ABNORMAL LOW (ref 8.9–10.3)
Chloride: 90 mmol/L — ABNORMAL LOW (ref 98–111)
Creatinine, Ser: <0.3 mg/dL — ABNORMAL LOW (ref 0.44–1.00)
Glucose, Bld: 136 mg/dL — ABNORMAL HIGH (ref 70–99)
Potassium: 4.6 mmol/L (ref 3.5–5.1)
Sodium: 135 mmol/L (ref 135–145)

## 2021-10-11 DIAGNOSIS — J9621 Acute and chronic respiratory failure with hypoxia: Secondary | ICD-10-CM | POA: Diagnosis not present

## 2021-10-11 DIAGNOSIS — J449 Chronic obstructive pulmonary disease, unspecified: Secondary | ICD-10-CM | POA: Diagnosis not present

## 2021-10-11 DIAGNOSIS — J189 Pneumonia, unspecified organism: Secondary | ICD-10-CM | POA: Diagnosis not present

## 2021-10-11 DIAGNOSIS — J984 Other disorders of lung: Secondary | ICD-10-CM | POA: Diagnosis not present

## 2021-10-11 NOTE — Progress Notes (Signed)
Pulmonary Critical Care Medicine [facility] [place]   PULMONARY CRITICAL CARE SERVICE  PROGRESS NOTE     Theresa Villa  PPI:[id number]  DOB: 1956-04-02   DOA: 09/20/2021  Referring Physician: Luna Kitchens, MD  HPI: Theresa Villa is a 65 y.o. female being followed for ventilator/airway/oxygen weaning Acute on Chronic Respiratory Failure.  Patient is on full support on the ventilator currently requiring 60% oxygen.  Over the weekend attempt was made at 24 hours ABG was done that showed pH dropped to 7.30 and PCO2 also went up along with her oxygen requirements of 60% she is not ready for extubation.  I spoke with the patient's husband as well as her son and they are in agreement to go forward with getting a tracheostomy  Medications: Reviewed on Rounds  Physical Exam:  Vitals: Temperature is 97.0 pulse 76 respiratory 26 blood pressure is 125/82 saturations 97%  Ventilator Settings on assist control FiO2 60% tidal volume 350 PEEP 5  General: Comfortable at this time Neck: supple Cardiovascular: no malignant arrhythmias Respiratory: No rhonchi no rales are noted at this time. Skin: no rash seen on limited exam Musculoskeletal: No gross abnormality Psychiatric:unable to assess Neurologic:no involuntary movements         Lab Data:   Basic Metabolic Panel: Recent Labs  Lab 10/07/21 0303 10/10/21 0333  NA 135 135  K 4.3 4.6  CL 91* 90*  CO2 39* 40*  GLUCOSE 97 136*  BUN 29* 25*  CREATININE <0.30* <0.30*  CALCIUM 8.3* 8.3*  MG  --  2.0    ABG: Recent Labs  Lab 10/10/21 1100  PHART 7.302*  PCO2ART 82.9*  PO2ART 72.8*  HCO3 39.7*  O2SAT 92.6    Liver Function Tests: No results for input(s): AST, ALT, ALKPHOS, BILITOT, PROT, ALBUMIN in the last 168 hours. No results for input(s): LIPASE, AMYLASE in the last 168 hours. No results for input(s): AMMONIA in the last 168 hours.  CBC: Recent Labs  Lab 10/05/21 0422 10/07/21 0303 10/10/21 0333   WBC 13.0* 9.6 9.4  HGB 8.8* 8.3* 8.2*  HCT 30.6* 28.7* 30.0*  MCV 79.3* 79.5* 82.4  PLT 148* 149* 178    Cardiac Enzymes: No results for input(s): CKTOTAL, CKMB, CKMBINDEX, TROPONINI in the last 168 hours.  BNP (last 3 results) No results for input(s): BNP in the last 8760 hours.  ProBNP (last 3 results) No results for input(s): PROBNP in the last 8760 hours.  Radiological Exams: DG CHEST PORT 1 VIEW  Result Date: 10/10/2021 CLINICAL DATA:  Shortness of breath EXAM: PORTABLE CHEST 1 VIEW COMPARISON:  Five days ago FINDINGS: Endotracheal tube with tip below the clavicular heads and above the carina. The enteric tube reaches the stomach. Emphysema with left apical cavity and dense left base opacification. Reticulonodular opacity at the right base is unchanged. Normal heart size. IMPRESSION: 1. Unremarkable hardware. 2. Severe emphysema with left apical cavity and dense left base pneumonia. No change from 5 days ago. Electronically Signed   By: Tiburcio Pea M.D.   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   Acute on chronic respiratory failure with hypoxia (HCC)   Healthcare-associated pneumonia   MAI (mycobacterium avium-intracellulare) infection (HCC)   Acute on chronic respiratory failure with hypoxia plan is going to be to continue with full support on the ventilator until we can get the tracheostomy done at that point we will move  towards weaning more aggressively Severe COPD end-stage disease supportive care Lung cavity massive destruction of the left lung we will continue with supportive care Healthcare associated pneumonia has been treated with antibiotics MAI being followed by infectious disease   I have personally seen and evaluated the patient, evaluated laboratory and imaging results, formulated the assessment and plan and placed orders. The Patient requires high complexity decision  making with multiple systems involvement.  Rounds were done with the Respiratory Therapy Director and Staff therapists and discussed with nursing staff also.  Yevonne Pax, MD [facility] Pulmonary Critical Care Medicine Sleep Medicine

## 2021-10-12 ENCOUNTER — Other Ambulatory Visit (HOSPITAL_COMMUNITY): Payer: Medicare Other

## 2021-10-12 DIAGNOSIS — J9621 Acute and chronic respiratory failure with hypoxia: Secondary | ICD-10-CM | POA: Diagnosis not present

## 2021-10-12 DIAGNOSIS — J189 Pneumonia, unspecified organism: Secondary | ICD-10-CM | POA: Diagnosis not present

## 2021-10-12 DIAGNOSIS — J984 Other disorders of lung: Secondary | ICD-10-CM | POA: Diagnosis not present

## 2021-10-12 DIAGNOSIS — J449 Chronic obstructive pulmonary disease, unspecified: Secondary | ICD-10-CM | POA: Diagnosis not present

## 2021-10-12 LAB — BASIC METABOLIC PANEL
Anion gap: 6 (ref 5–15)
BUN: 23 mg/dL (ref 8–23)
CO2: 38 mmol/L — ABNORMAL HIGH (ref 22–32)
Calcium: 8.2 mg/dL — ABNORMAL LOW (ref 8.9–10.3)
Chloride: 90 mmol/L — ABNORMAL LOW (ref 98–111)
Creatinine, Ser: <0.3 mg/dL — ABNORMAL LOW (ref 0.44–1.00)
Glucose, Bld: 80 mg/dL (ref 70–99)
Potassium: 4.5 mmol/L (ref 3.5–5.1)
Sodium: 134 mmol/L — ABNORMAL LOW (ref 135–145)

## 2021-10-12 LAB — CBC
HCT: 24.7 % — ABNORMAL LOW (ref 36.0–46.0)
Hemoglobin: 7.3 g/dL — ABNORMAL LOW (ref 12.0–15.0)
MCH: 23.3 pg — ABNORMAL LOW (ref 26.0–34.0)
MCHC: 29.6 g/dL — ABNORMAL LOW (ref 30.0–36.0)
MCV: 78.9 fL — ABNORMAL LOW (ref 80.0–100.0)
Platelets: 153 10*3/uL (ref 150–400)
RBC: 3.13 MIL/uL — ABNORMAL LOW (ref 3.87–5.11)
RDW: 20.5 % — ABNORMAL HIGH (ref 11.5–15.5)
WBC: 8.4 10*3/uL (ref 4.0–10.5)
nRBC: 0 % (ref 0.0–0.2)

## 2021-10-12 LAB — BLOOD GAS, ARTERIAL
Acid-Base Excess: 14.3 mmol/L — ABNORMAL HIGH (ref 0.0–2.0)
Bicarbonate: 39.9 mmol/L — ABNORMAL HIGH (ref 20.0–28.0)
FIO2: 60
O2 Saturation: 95.1 %
Patient temperature: 36.6
pCO2 arterial: 65 mmHg — ABNORMAL HIGH (ref 32.0–48.0)
pH, Arterial: 7.402 (ref 7.350–7.450)
pO2, Arterial: 75.8 mmHg — ABNORMAL LOW (ref 83.0–108.0)

## 2021-10-12 NOTE — Progress Notes (Signed)
Pulmonary Critical Care Medicine [facility] [place]   PULMONARY CRITICAL CARE SERVICE  PROGRESS NOTE     Chayce Rullo  YKD:[id number]  DOB: 01-Mar-1956   DOA: 09/20/2021  Referring Physician: Luna Kitchens, MD  HPI: Theresa Villa is a 65 y.o. female being followed for ventilator/airway/oxygen weaning Acute on Chronic Respiratory Failure.  She remains orally intubated we are waiting about the tracheostomy to be done.  Spoke to the husband and gave him an update this morning  Medications: Reviewed on Rounds  Physical Exam:  Vitals: Temperature is 97.8 pulse 80 respiratory rate is 26 blood pressure is 97/59 saturations 100%  Ventilator Settings on assist control FiO2 60% tidal volume 350 PEEP 5  General: Comfortable at this time Neck: supple Cardiovascular: no malignant arrhythmias Respiratory: No rhonchi very coarse breath sounds Skin: no rash seen on limited exam Musculoskeletal: No gross abnormality Psychiatric:unable to assess Neurologic:no involuntary movements         Lab Data:   Basic Metabolic Panel: Recent Labs  Lab 10/07/21 0303 10/10/21 0333 10/12/21 0344  NA 135 135 134*  K 4.3 4.6 4.5  CL 91* 90* 90*  CO2 39* 40* 38*  GLUCOSE 97 136* 80  BUN 29* 25* 23  CREATININE <0.30* <0.30* <0.30*  CALCIUM 8.3* 8.3* 8.2*  MG  --  2.0  --     ABG: Recent Labs  Lab 10/10/21 1100 10/12/21 0942  PHART 7.302* 7.402  PCO2ART 82.9* 65.0*  PO2ART 72.8* 75.8*  HCO3 39.7* 39.9*  O2SAT 92.6 95.1    Liver Function Tests: No results for input(s): AST, ALT, ALKPHOS, BILITOT, PROT, ALBUMIN in the last 168 hours. No results for input(s): LIPASE, AMYLASE in the last 168 hours. No results for input(s): AMMONIA in the last 168 hours.  CBC: Recent Labs  Lab 10/07/21 0303 10/10/21 0333 10/12/21 0344  WBC 9.6 9.4 8.4  HGB 8.3* 8.2* 7.3*  HCT 28.7* 30.0* 24.7*  MCV 79.5* 82.4 78.9*  PLT 149* 178 153    Cardiac Enzymes: No results for input(s):  CKTOTAL, CKMB, CKMBINDEX, TROPONINI in the last 168 hours.  BNP (last 3 results) No results for input(s): BNP in the last 8760 hours.  ProBNP (last 3 results) No results for input(s): PROBNP in the last 8760 hours.  Radiological Exams: DG CHEST PORT 1 VIEW  Result Date: 10/12/2021 CLINICAL DATA:  COPD EXAM: PORTABLE CHEST 1 VIEW COMPARISON:  Radiograph 10/10/2021, chest CT 09/22/2021 FINDINGS: Endotracheal tube overlies the midthoracic trachea. Nasogastric tube passes below the diaphragm, tip excluded by collimation. Unchanged severe bullous emphysema with left apical cavity and dense left basilar consolidation. Unchanged left pleural effusion. Unchanged reticulonodular opacities in the right lung base. IMPRESSION: Unchanged severe emphysema with left apical cavity, dense left basilar pneumonia, and left pleural effusion. No new findings. Electronically Signed   By: Caprice Renshaw M.D.   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  After that then we can start oral aggressive weaning. Severe COPD medical management we will continue to follow along closely. Left lung cavity massive cavitation of the left lung with entire destruction of the tissue Healthcare associated pneumonia treated  we will continue to follow. MAI follow-up infectious disease recommendations   I have personally seen and evaluated the patient, evaluated laboratory and imaging results, formulated the assessment and plan and placed orders. The Patient requires high complexity decision making with multiple systems involvement.  Rounds were done with the  Respiratory Therapy Director and Staff therapists and discussed with nursing staff also.  Yevonne Pax, MD [facility] Pulmonary Critical Care Medicine Sleep Medicine

## 2021-10-13 DIAGNOSIS — J984 Other disorders of lung: Secondary | ICD-10-CM | POA: Diagnosis not present

## 2021-10-13 DIAGNOSIS — J9621 Acute and chronic respiratory failure with hypoxia: Secondary | ICD-10-CM | POA: Diagnosis not present

## 2021-10-13 DIAGNOSIS — J189 Pneumonia, unspecified organism: Secondary | ICD-10-CM | POA: Diagnosis not present

## 2021-10-13 DIAGNOSIS — J449 Chronic obstructive pulmonary disease, unspecified: Secondary | ICD-10-CM | POA: Diagnosis not present

## 2021-10-13 LAB — CBC
HCT: 25.8 % — ABNORMAL LOW (ref 36.0–46.0)
Hemoglobin: 7.6 g/dL — ABNORMAL LOW (ref 12.0–15.0)
MCH: 23.2 pg — ABNORMAL LOW (ref 26.0–34.0)
MCHC: 29.5 g/dL — ABNORMAL LOW (ref 30.0–36.0)
MCV: 78.7 fL — ABNORMAL LOW (ref 80.0–100.0)
Platelets: 170 10*3/uL (ref 150–400)
RBC: 3.28 MIL/uL — ABNORMAL LOW (ref 3.87–5.11)
RDW: 20.6 % — ABNORMAL HIGH (ref 11.5–15.5)
WBC: 8.7 10*3/uL (ref 4.0–10.5)
nRBC: 0 % (ref 0.0–0.2)

## 2021-10-13 NOTE — Progress Notes (Signed)
Pulmonary Critical Care Medicine [facility] [place]   PULMONARY CRITICAL CARE SERVICE  PROGRESS NOTE     Theresa Villa  DTO:[id number]  DOB: [birth year]   DOA: 09/20/2021  Referring Physician: Luna Kitchens, MD  HPI: Theresa Villa is a 65 y.o. female being followed for ventilator/airway/oxygen weaning Acute on Chronic Respiratory Failure.  Patient is on the ventilator and full support.  The patient's son was present in the room and updated still waiting on tracheostomy to be done  Medications: Reviewed on Rounds  Physical Exam:  Vitals: Temperature is 97.6 pulse 88 respiratory is 30 blood pressure 121/54 saturations 97%  Ventilator Settings on assist control FiO2 is 50% tidal volume 350 PEEP 5  General: Comfortable at this time Neck: supple Cardiovascular: no malignant arrhythmias Respiratory: Scattered rhonchi very coarse breath sounds Skin: no rash seen on limited exam Musculoskeletal: No gross abnormality Psychiatric:unable to assess Neurologic:no involuntary movements         Lab Data:   Basic Metabolic Panel: Recent Labs  Lab 10/07/21 0303 10/10/21 0333 10/12/21 0344  NA 135 135 134*  K 4.3 4.6 4.5  CL 91* 90* 90*  CO2 39* 40* 38*  GLUCOSE 97 136* 80  BUN 29* 25* 23  CREATININE <0.30* <0.30* <0.30*  CALCIUM 8.3* 8.3* 8.2*  MG  --  2.0  --     ABG: Recent Labs  Lab 10/10/21 1100 10/12/21 0942  PHART 7.302* 7.402  PCO2ART 82.9* 65.0*  PO2ART 72.8* 75.8*  HCO3 39.7* 39.9*  O2SAT 92.6 95.1    Liver Function Tests: No results for input(s): AST, ALT, ALKPHOS, BILITOT, PROT, ALBUMIN in the last 168 hours. No results for input(s): LIPASE, AMYLASE in the last 168 hours. No results for input(s): AMMONIA in the last 168 hours.  CBC: Recent Labs  Lab 10/07/21 0303 10/10/21 0333 10/12/21 0344 10/13/21 0433  WBC 9.6 9.4 8.4 8.7  HGB 8.3* 8.2* 7.3* 7.6*  HCT 28.7* 30.0* 24.7* 25.8*  MCV 79.5* 82.4 78.9* 78.7*  PLT 149* 178 153 170     Cardiac Enzymes: No results for input(s): CKTOTAL, CKMB, CKMBINDEX, TROPONINI in the last 168 hours.  BNP (last 3 results) No results for input(s): BNP in the last 8760 hours.  ProBNP (last 3 results) No results for input(s): PROBNP in the last 8760 hours.  Radiological Exams: DG CHEST PORT 1 VIEW  Result Date: 10/12/2021 CLINICAL DATA:  COPD EXAM: PORTABLE CHEST 1 VIEW COMPARISON:  Radiograph 10/10/2021, chest CT 09/22/2021 FINDINGS: Endotracheal tube overlies the midthoracic trachea. Nasogastric tube passes below the diaphragm, tip excluded by collimation. Unchanged severe bullous emphysema with left apical cavity and dense left basilar consolidation. Unchanged left pleural effusion. Unchanged reticulonodular opacities in the right lung base. IMPRESSION: Unchanged severe emphysema with left apical cavity, dense left basilar pneumonia, and left pleural effusion. No new findings. Electronically Signed   By: Caprice Renshaw M.D.   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  I did also explain to the son that even after the doing the tracheostomy it is no guarantee that  she will be liberated from the ventilator and she may still require some form of ventilatory support.  He states he understands and he is going to hope for the best but understands that there is a chance that she may be vent dependent even after her trach is done Severe COPD end-stage disease prognosis is poor Cavitating left lung prognosis is guarded Healthcare associated pneumonia  treated MAI supportive care prognosis guarded   I have personally seen and evaluated the patient, evaluated laboratory and imaging results, formulated the assessment and plan and placed orders. The Patient requires high complexity decision making with multiple systems involvement.  Rounds were done with the Respiratory Therapy Director and Staff therapists and discussed with nursing staff also.  Yevonne Pax, MD [facility] Pulmonary Critical Care Medicine Sleep Medicine

## 2021-10-14 DIAGNOSIS — J984 Other disorders of lung: Secondary | ICD-10-CM | POA: Diagnosis not present

## 2021-10-14 DIAGNOSIS — J189 Pneumonia, unspecified organism: Secondary | ICD-10-CM | POA: Diagnosis not present

## 2021-10-14 DIAGNOSIS — J9621 Acute and chronic respiratory failure with hypoxia: Secondary | ICD-10-CM | POA: Diagnosis not present

## 2021-10-14 DIAGNOSIS — J449 Chronic obstructive pulmonary disease, unspecified: Secondary | ICD-10-CM | POA: Diagnosis not present

## 2021-10-14 NOTE — Progress Notes (Signed)
Pulmonary Critical Care Medicine [facility] [place]   PULMONARY CRITICAL CARE SERVICE  PROGRESS NOTE     Theresa Villa  KNL:[id number]  DOB: June 24, 1956   DOA: 09/20/2021  Referring Physician: Luna Kitchens, MD  HPI: Theresa Villa is a 65 y.o. female being followed for ventilator/airway/oxygen weaning Acute on Chronic Respiratory Failure.  Patient remains on the ventilator on assist control mode has been on 55% FiO2 saturations are good  Medications: Reviewed on Rounds  Physical Exam:  Vitals: Temperature is 98.8 pulse 78 respiratory is 26 blood pressure was 115/60 saturations 97%  Ventilator Settings on assist control FiO2 is 55% tidal volumes 350 PEEP of 5  General: Comfortable at this time Neck: supple Cardiovascular: no malignant arrhythmias Respiratory: Scattered rhonchi very coarse breath sounds Skin: no rash seen on limited exam Musculoskeletal: No gross abnormality Psychiatric:unable to assess Neurologic:no involuntary movements         Lab Data:   Basic Metabolic Panel: Recent Labs  Lab 10/10/21 0333 10/12/21 0344  NA 135 134*  K 4.6 4.5  CL 90* 90*  CO2 40* 38*  GLUCOSE 136* 80  BUN 25* 23  CREATININE <0.30* <0.30*  CALCIUM 8.3* 8.2*  MG 2.0  --     ABG: Recent Labs  Lab 10/10/21 1100 10/12/21 0942  PHART 7.302* 7.402  PCO2ART 82.9* 65.0*  PO2ART 72.8* 75.8*  HCO3 39.7* 39.9*  O2SAT 92.6 95.1    Liver Function Tests: No results for input(s): AST, ALT, ALKPHOS, BILITOT, PROT, ALBUMIN in the last 168 hours. No results for input(s): LIPASE, AMYLASE in the last 168 hours. No results for input(s): AMMONIA in the last 168 hours.  CBC: Recent Labs  Lab 10/10/21 0333 10/12/21 0344 10/13/21 0433  WBC 9.4 8.4 8.7  HGB 8.2* 7.3* 7.6*  HCT 30.0* 24.7* 25.8*  MCV 82.4 78.9* 78.7*  PLT 178 153 170    Cardiac Enzymes: No results for input(s): CKTOTAL, CKMB, CKMBINDEX, TROPONINI in the last 168 hours.  BNP (last 3  results) No results for input(s): BNP in the last 8760 hours.  ProBNP (last 3 results) No results for input(s): PROBNP in the last 8760 hours.  Radiological Exams: DG CHEST PORT 1 VIEW  Result Date: 10/12/2021 CLINICAL DATA:  COPD EXAM: PORTABLE CHEST 1 VIEW COMPARISON:  Radiograph 10/10/2021, chest CT 09/22/2021 FINDINGS: Endotracheal tube overlies the midthoracic trachea. Nasogastric tube passes below the diaphragm, tip excluded by collimation. Unchanged severe bullous emphysema with left apical cavity and dense left basilar consolidation. Unchanged left pleural effusion. Unchanged reticulonodular opacities in the right lung base. IMPRESSION: Unchanged severe emphysema with left apical cavity, dense left basilar pneumonia, and left pleural effusion. No new findings. Electronically Signed   By: Caprice Renshaw M.D.   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 Severe COPD supportive care prognosis is guarded. Healthcare associated pneumonia treated continue to monitor closely. Cavitating left lung prognosis guarded MAI supportive care infectious disease following   I have personally  seen and evaluated the patient, evaluated laboratory and imaging results, formulated the assessment and plan and placed orders. The Patient requires high complexity decision making with multiple systems involvement.  Rounds were done with the Respiratory Therapy Director and Staff therapists and  discussed with nursing staff also.  Yevonne Pax, MD [facility] Pulmonary Critical Care Medicine Sleep Medicine

## 2021-10-15 ENCOUNTER — Telehealth: Payer: Self-pay | Admitting: Internal Medicine

## 2021-10-15 DIAGNOSIS — J984 Other disorders of lung: Secondary | ICD-10-CM | POA: Diagnosis not present

## 2021-10-15 DIAGNOSIS — J449 Chronic obstructive pulmonary disease, unspecified: Secondary | ICD-10-CM | POA: Diagnosis not present

## 2021-10-15 DIAGNOSIS — J189 Pneumonia, unspecified organism: Secondary | ICD-10-CM | POA: Diagnosis not present

## 2021-10-15 DIAGNOSIS — J9621 Acute and chronic respiratory failure with hypoxia: Secondary | ICD-10-CM | POA: Diagnosis not present

## 2021-10-15 LAB — CBC
HCT: 27.1 % — ABNORMAL LOW (ref 36.0–46.0)
Hemoglobin: 7.9 g/dL — ABNORMAL LOW (ref 12.0–15.0)
MCH: 23.1 pg — ABNORMAL LOW (ref 26.0–34.0)
MCHC: 29.2 g/dL — ABNORMAL LOW (ref 30.0–36.0)
MCV: 79.2 fL — ABNORMAL LOW (ref 80.0–100.0)
Platelets: 227 10*3/uL (ref 150–400)
RBC: 3.42 MIL/uL — ABNORMAL LOW (ref 3.87–5.11)
RDW: 20.7 % — ABNORMAL HIGH (ref 11.5–15.5)
WBC: 13.1 10*3/uL — ABNORMAL HIGH (ref 4.0–10.5)
nRBC: 0 % (ref 0.0–0.2)

## 2021-10-15 LAB — BASIC METABOLIC PANEL
Anion gap: 7 (ref 5–15)
BUN: 24 mg/dL — ABNORMAL HIGH (ref 8–23)
CO2: 37 mmol/L — ABNORMAL HIGH (ref 22–32)
Calcium: 8.4 mg/dL — ABNORMAL LOW (ref 8.9–10.3)
Chloride: 89 mmol/L — ABNORMAL LOW (ref 98–111)
Creatinine, Ser: <0.3 mg/dL — ABNORMAL LOW (ref 0.44–1.00)
Glucose, Bld: 116 mg/dL — ABNORMAL HIGH (ref 70–99)
Potassium: 4 mmol/L (ref 3.5–5.1)
Sodium: 133 mmol/L — ABNORMAL LOW (ref 135–145)

## 2021-10-15 LAB — MAGNESIUM: Magnesium: 1.8 mg/dL (ref 1.7–2.4)

## 2021-10-15 NOTE — Telephone Encounter (Signed)
LMTCB for [employer]

## 2021-10-15 NOTE — Progress Notes (Signed)
Pulmonary Critical Care Medicine [facility] [place]   PULMONARY CRITICAL CARE SERVICE  PROGRESS NOTE     Theresa Villa  [id number]:[id number]  DOB: Aug 15, 1956   DOA: 09/20/2021  Referring Physician: Luna Kitchens, MD  HPI: Theresa Villa is a 65 y.o. female being followed for ventilator/airway/oxygen weaning Acute on Chronic Respiratory Failure.  Patient is on assist control mode has been on 50% FiO2 saturations are good.  Patient is awaiting transfer for tracheostomy and they would like to come back here if possible after the tracheostomy is done  Medications: Reviewed on Rounds  Physical Exam:  Vitals: Temperature is 96.2 pulse 90 respiratory is 24 blood pressure is 115/80 saturations 95%  Ventilator Settings on assist control FiO2 is 50% tidal volume is 350 PEEP 5  General: Comfortable at this time Neck: supple Cardiovascular: no malignant arrhythmias Respiratory: No rhonchi very coarse breath sounds Skin: no rash seen on limited exam Musculoskeletal: No gross abnormality Psychiatric:unable to assess Neurologic:no involuntary movements         Lab Data:   Basic Metabolic Panel: Recent Labs  Lab 10/10/21 0333 10/12/21 0344 10/15/21 0439  NA 135 134* 133*  K 4.6 4.5 4.0  CL 90* 90* 89*  CO2 40* 38* 37*  GLUCOSE 136* 80 116*  BUN 25* 23 24*  CREATININE <0.30* <0.30* <0.30*  CALCIUM 8.3* 8.2* 8.4*  MG 2.0  --  1.8    ABG: Recent Labs  Lab 10/10/21 1100 10/12/21 0942  PHART 7.302* 7.402  PCO2ART 82.9* 65.0*  PO2ART 72.8* 75.8*  HCO3 39.7* 39.9*  O2SAT 92.6 95.1    Liver Function Tests: No results for input(s): AST, ALT, ALKPHOS, BILITOT, PROT, ALBUMIN in the last 168 hours. No results for input(s): LIPASE, AMYLASE in the last 168 hours. No results for input(s): AMMONIA in the last 168 hours.  CBC: Recent Labs  Lab 10/10/21 0333 10/12/21 0344 10/13/21 0433 10/15/21 0439  WBC 9.4 8.4 8.7 13.1*  HGB 8.2* 7.3* 7.6* 7.9*  HCT 30.0* 24.7*  25.8* 27.1*  MCV 82.4 78.9* 78.7* 79.2*  PLT 178 153 170 227    Cardiac Enzymes: No results for input(s): CKTOTAL, CKMB, CKMBINDEX, TROPONINI in the last 168 hours.  BNP (last 3 results) No results for input(s): BNP in the last 8760 hours.  ProBNP (last 3 results) No results for input(s): PROBNP in the last 8760 hours.  Radiological Exams: No results found.  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   Acute on chronic respiratory failure with hypoxia (HCC)   Healthcare-associated pneumonia   MAI (mycobacterium avium-intracellulare) infection (HCC)   Acute on chronic respiratory failure with hypoxia patient continues to be vent dependent on full support right now on assist control mode.  Awaiting tracheostomy Severe COPD medical management we will continue to monitor along closely. Cavitary left lung prognosis guarded Healthcare associated pneumonia has been treated MAI supportive care infectious disease following   I have personally seen and evaluated the patient, evaluated laboratory and imaging results, formulated the assessment and plan and placed orders. The Patient requires high complexity decision making with multiple systems involvement.  Rounds were done with the Respiratory Therapy Director and Staff therapists and discussed with nursing staff also.  Yevonne Pax, MD [facility] Pulmonary Critical Care Medicine Sleep Medicine

## 2021-10-15 NOTE — Telephone Encounter (Signed)
"  Pt in hospital trying to find a surgeon that can remove the trach to wean off ventilator.  Pt was seeing a Dr. in [place] due to being closer, but had seen Dr. Sherene Sires in the past.  Dr. she was seeing retired same wk pt was admitted. They're trying to find surgeon in [place] to keep from transferring pt to [facility] because of pt's condition. Please advise"   Dr. Sherene Sires please advise

## 2021-10-15 NOTE — Telephone Encounter (Signed)
The doctors there would need to call our CCM team at [facility] to work out the transfer since he has been on a ventilator   Care link [phone]

## 2021-10-15 NOTE — Telephone Encounter (Signed)
Theresa Villa husband is returning phone call. Theresa Villa phone number is [phone].

## 2021-10-15 NOTE — Telephone Encounter (Signed)
They're trying to find surgeon in [place] to keep from transferring pt to [facility] because of pt's condition.

## 2021-10-15 NOTE — Telephone Encounter (Signed)
Called back x 2 and there was no answer, line just rings, will call back next wk.

## 2021-10-17 ENCOUNTER — Other Ambulatory Visit (HOSPITAL_COMMUNITY): Payer: Medicare Other

## 2021-10-17 LAB — BASIC METABOLIC PANEL
Anion gap: 9 (ref 5–15)
BUN: 26 mg/dL — ABNORMAL HIGH (ref 8–23)
CO2: 37 mmol/L — ABNORMAL HIGH (ref 22–32)
Calcium: 8.2 mg/dL — ABNORMAL LOW (ref 8.9–10.3)
Chloride: 90 mmol/L — ABNORMAL LOW (ref 98–111)
Creatinine, Ser: <0.3 mg/dL — ABNORMAL LOW (ref 0.44–1.00)
Glucose, Bld: 126 mg/dL — ABNORMAL HIGH (ref 70–99)
Potassium: 4.2 mmol/L (ref 3.5–5.1)
Sodium: 136 mmol/L (ref 135–145)

## 2021-10-17 LAB — CBC
HCT: 28.5 % — ABNORMAL LOW (ref 36.0–46.0)
Hemoglobin: 8.4 g/dL — ABNORMAL LOW (ref 12.0–15.0)
MCH: 23.1 pg — ABNORMAL LOW (ref 26.0–34.0)
MCHC: 29.5 g/dL — ABNORMAL LOW (ref 30.0–36.0)
MCV: 78.5 fL — ABNORMAL LOW (ref 80.0–100.0)
Platelets: 280 10*3/uL (ref 150–400)
RBC: 3.63 MIL/uL — ABNORMAL LOW (ref 3.87–5.11)
RDW: 20.4 % — ABNORMAL HIGH (ref 11.5–15.5)
WBC: 22.2 10*3/uL — ABNORMAL HIGH (ref 4.0–10.5)
nRBC: 0 % (ref 0.0–0.2)

## 2021-10-17 LAB — C DIFFICILE (CDIFF) QUICK SCRN (NO PCR REFLEX)
C Diff antigen: POSITIVE — AB
C Diff interpretation: DETECTED
C Diff toxin: POSITIVE — AB

## 2021-10-18 ENCOUNTER — Ambulatory Visit (HOSPITAL_COMMUNITY)
Admission: AD | Admit: 2021-10-18 | Discharge: 2021-10-18 | Disposition: A | Discharge status: Home / Self Care | Payer: Medicare Other | Source: Other Acute Inpatient Hospital | Attending: Internal Medicine | Admitting: Internal Medicine

## 2021-10-18 DIAGNOSIS — J9690 Respiratory failure, unspecified, unspecified whether with hypoxia or hypercapnia: Secondary | ICD-10-CM | POA: Insufficient documentation

## 2021-10-18 DIAGNOSIS — J984 Other disorders of lung: Secondary | ICD-10-CM | POA: Diagnosis not present

## 2021-10-18 DIAGNOSIS — J189 Pneumonia, unspecified organism: Secondary | ICD-10-CM | POA: Diagnosis not present

## 2021-10-18 DIAGNOSIS — J9621 Acute and chronic respiratory failure with hypoxia: Secondary | ICD-10-CM | POA: Diagnosis not present

## 2021-10-18 DIAGNOSIS — J449 Chronic obstructive pulmonary disease, unspecified: Secondary | ICD-10-CM | POA: Diagnosis not present

## 2021-10-18 LAB — CBC
HCT: 25.8 % — ABNORMAL LOW (ref 36.0–46.0)
Hemoglobin: 7.5 g/dL — ABNORMAL LOW (ref 12.0–15.0)
MCH: 22.9 pg — ABNORMAL LOW (ref 26.0–34.0)
MCHC: 29.1 g/dL — ABNORMAL LOW (ref 30.0–36.0)
MCV: 78.7 fL — ABNORMAL LOW (ref 80.0–100.0)
Platelets: 286 10*3/uL (ref 150–400)
RBC: 3.28 MIL/uL — ABNORMAL LOW (ref 3.87–5.11)
RDW: 20.5 % — ABNORMAL HIGH (ref 11.5–15.5)
WBC: 21 10*3/uL — ABNORMAL HIGH (ref 4.0–10.5)
nRBC: 0 % (ref 0.0–0.2)

## 2021-10-18 NOTE — Progress Notes (Signed)
Pulmonary Critical Care Medicine [facility] [place]   PULMONARY CRITICAL CARE SERVICE  PROGRESS NOTE     Theresa Villa  JOI:[id number]  DOB: [birth year]   DOA: 09/20/2021  Referring Physician: Luna Kitchens, MD  HPI: Theresa Villa is a 65 y.o. female being followed for ventilator/airway/oxygen weaning Acute on Chronic Respiratory Failure.  Patient is comfortable without distress at this time has been on assist control mode 50% FiO2 and remains orally intubated  Medications: Reviewed on Rounds  Physical Exam:  Vitals: Currently temperature is 98.2 pulse 98 respiratory is 26 blood pressure is 126/65 saturations 96%  Ventilator Settings on assist control FiO2 is 50% tidal volume is 350  General: Comfortable at this time Neck: supple Cardiovascular: no malignant arrhythmias Respiratory: No rhonchi very coarse breath sounds Skin: no rash seen on limited exam Musculoskeletal: No gross abnormality Psychiatric:unable to assess Neurologic:no involuntary movements         Lab Data:   Basic Metabolic Panel: Recent Labs  Lab 10/12/21 0344 10/15/21 0439 10/17/21 0522  NA 134* 133* 136  K 4.5 4.0 4.2  CL 90* 89* 90*  CO2 38* 37* 37*  GLUCOSE 80 116* 126*  BUN 23 24* 26*  CREATININE <0.30* <0.30* <0.30*  CALCIUM 8.2* 8.4* 8.2*  MG  --  1.8  --     ABG: Recent Labs  Lab 10/12/21 0942  PHART 7.402  PCO2ART 65.0*  PO2ART 75.8*  HCO3 39.9*  O2SAT 95.1    Liver Function Tests: No results for input(s): AST, ALT, ALKPHOS, BILITOT, PROT, ALBUMIN in the last 168 hours. No results for input(s): LIPASE, AMYLASE in the last 168 hours. No results for input(s): AMMONIA in the last 168 hours.  CBC: Recent Labs  Lab 10/12/21 0344 10/13/21 0433 10/15/21 0439 10/17/21 0522 10/18/21 0426  WBC 8.4 8.7 13.1* 22.2* 21.0*  HGB 7.3* 7.6* 7.9* 8.4* 7.5*  HCT 24.7* 25.8* 27.1* 28.5* 25.8*  MCV 78.9* 78.7* 79.2* 78.5* 78.7*  PLT 153 170 227 280 286    Cardiac  Enzymes: No results for input(s): CKTOTAL, CKMB, CKMBINDEX, TROPONINI in the last 168 hours.  BNP (last 3 results) No results for input(s): BNP in the last 8760 hours.  ProBNP (last 3 results) No results for input(s): PROBNP in the last 8760 hours.  Radiological Exams: DG Chest Port 1 View  Result Date: 10/17/2021 CLINICAL DATA:  Respiratory failure EXAM: PORTABLE CHEST 1 VIEW COMPARISON:  Chest x-rays dated 10/12/2021 and 10/10/2021. Chest CT dated 09/22/2021. FINDINGS: Endotracheal tube appears well positioned with tip above the level of the carina. Enteric tube passes below the diaphragm. Stable dense opacity at the LEFT lung base. Stable large lucency at the LEFT lung apex, described as a 13 cm cavitary lesion on recent chest CT report of 09/22/2021. Stable appearance of the coarse interstitial markings within the RIGHT lung. No new lung findings. Heart size and mediastinal contours appear stable. IMPRESSION: 1. Stable chest x-ray. Stable dense opacity at the LEFT lung base, compatible with pneumonia and/or atelectasis as better demonstrated on recent chest CT of 09/22/2021, with adjacent pleural effusion. 2. No new lung findings. 3. Support apparatus appears appropriately positioned. Electronically Signed   By: Bary Richard M.D.   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 Cavitating lung prognosis poor Healthcare associated pneumonia  treated we will continue to follow along MAI supportive care   I have personally seen and evaluated the patient, evaluated laboratory and imaging results, formulated the assessment and plan and placed orders. The Patient requires high complexity decision making with multiple systems involvement.  Rounds were done with the Respiratory Therapy Director and Staff therapists and discussed with nursing staff also.  Yevonne Pax, MD [facility] Pulmonary Critical Care Medicine Sleep Medicine

## 2021-10-19 NOTE — Telephone Encounter (Signed)
Tried [employer] again, no answer and no VM picked up. Closing this encounter per protocol.

## 2021-10-20 ENCOUNTER — Other Ambulatory Visit (HOSPITAL_COMMUNITY): Payer: Medicare Other | Admitting: Internal Medicine

## 2021-10-20 DIAGNOSIS — J984 Other disorders of lung: Secondary | ICD-10-CM

## 2021-10-20 DIAGNOSIS — J189 Pneumonia, unspecified organism: Secondary | ICD-10-CM | POA: Diagnosis not present

## 2021-10-20 DIAGNOSIS — J449 Chronic obstructive pulmonary disease, unspecified: Secondary | ICD-10-CM

## 2021-10-20 DIAGNOSIS — A310 Pulmonary mycobacterial infection: Secondary | ICD-10-CM

## 2021-10-20 DIAGNOSIS — J9621 Acute and chronic respiratory failure with hypoxia: Secondary | ICD-10-CM

## 2021-10-20 LAB — CULTURE, RESPIRATORY W GRAM STAIN
Culture: NORMAL
Gram Stain: NONE SEEN

## 2021-10-20 LAB — URINE CULTURE: Culture: >=100000 — AB

## 2021-10-20 NOTE — Progress Notes (Signed)
[facility]  PROGRESS NOTE  PULMONARY SERVICE ROUNDS   Feiga Nadel  DOB: 1956-01-10  Referring physician: Larena Glassman, MD  HPI: Theresa Villa is a 65 y.o. female  being seen for Acute on Chronic Respiratory Failure.  Patient had a tracheostomy done we have switched over to pressure support she looks very good and I think she should proceed quickly on the weaning.  Review of Systems: Unremarkable other than noted in HPI  Allergies:  Reviewed on the [facility]  Medications: Reviewed  Vitals: Temperature 97.6 pulse 96 respiratory rate is 24 blood pressure is 114/82 saturations 96%  Ventilator Settings: On assist control tidal volume is 350 FiO2 50% PEEP of 5  Physical Exam: General:  calm and comfortable NAD Eyes: normal lids, irises & conjunctiva ENT: grossly normal tongue not enlarged Neck: no masses Cardiovascular: S1 S2 Normal no rubs no gallop Respiratory: No rhonchi very coarse breath sounds Abdomen: soft non-distended Skin: no rash seen on limited exam Musculoskeletal:  no rigidity Psychiatric: unable to assess Neurologic: no involuntary movements          Lab Data and radiological Data:  Labs of been reviewed   Assessment/Plan  Patient Active Problem List   Diagnosis Date Noted   Acute on chronic respiratory failure with hypoxia (HCC) 09/22/2021   Healthcare-associated pneumonia 09/22/2021   MAI (mycobacterium avium-intracellulare) infection (HCC) 09/22/2021   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  She does have a lot of issues with anxiety husband reports she was on Xanax 4 times a day at home we will get her  restarted on that. Healthcare associated pneumonia-treated follow slow improvement follow-up x-rays MAI she is at baseline Left lung cavity massive cavitation of the left lung secondary to MAI disease Severe COPD prognosis is guarded   I have personally evaluated the patient, evaluated the laboratory and imaging results and formulated the assessment and plan and placed orders as needed. The Patient requires high complexity decision making with multiple system involvement. Rounds were done with the Respiratory Therapy Director and respiratory therapist involved in the care of the patient as well as nursing staff.   Yevonne Pax, MD [facility] Pulmonary Critical Care Medicine   This note is for inpatient care

## 2021-10-21 ENCOUNTER — Other Ambulatory Visit (HOSPITAL_COMMUNITY): Payer: Medicare Other | Admitting: Internal Medicine

## 2021-10-21 DIAGNOSIS — J984 Other disorders of lung: Secondary | ICD-10-CM

## 2021-10-21 DIAGNOSIS — J449 Chronic obstructive pulmonary disease, unspecified: Secondary | ICD-10-CM | POA: Diagnosis not present

## 2021-10-21 DIAGNOSIS — F17210 Nicotine dependence, cigarettes, uncomplicated: Secondary | ICD-10-CM

## 2021-10-21 DIAGNOSIS — J189 Pneumonia, unspecified organism: Secondary | ICD-10-CM

## 2021-10-21 DIAGNOSIS — J9621 Acute and chronic respiratory failure with hypoxia: Secondary | ICD-10-CM

## 2021-10-21 NOTE — Progress Notes (Signed)
[facility]  PROGRESS NOTE  PULMONARY SERVICE ROUNDS   Theresa Villa  DOB: 03-Sep-1956  Referring physician: Larena Glassman, MD  HPI: Theresa Villa is a 65 y.o. female  being seen for Acute on Chronic Respiratory Failure.  Doing very well has stayed on pressure support overnight  Review of Systems: Unremarkable other than noted in HPI  Allergies:  Reviewed on the [facility]  Medications: Reviewed  Vitals: Temperature 96.6 pulse 79 respiratory is 13 blood pressure is 106/56 saturations 98%  Ventilator Settings: On pressure support 10/5  Physical Exam: General:  calm and comfortable NAD Eyes: normal lids, irises & conjunctiva ENT: grossly normal tongue not enlarged Neck: no masses Cardiovascular: S1 S2 Normal no rubs no gallop Respiratory: No rhonchi or rales Abdomen: soft non-distended Skin: no rash seen on limited exam Musculoskeletal:  no rigidity Psychiatric: unable to assess Neurologic: no involuntary movements          Lab Data and radiological Data:  Data has been reviewed   Assessment/Plan  Patient Active Problem List   Diagnosis Date Noted   Acute on chronic respiratory failure with hypoxia (HCC) 09/22/2021   Healthcare-associated pneumonia 09/22/2021   MAI (mycobacterium avium-intracellulare) infection (HCC) 09/22/2021   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 The Patient requires high complexity decision making with multiple system  involvement. Rounds were done with the Respiratory Therapy Director and respiratory therapist involved in the care of the patient as well as nursing staff.   Yevonne Pax, MD [facility] Pulmonary Critical Care Medicine   This note is for inpatient care

## 2021-10-22 LAB — CULTURE, BLOOD (ROUTINE X 2)
Culture: NO GROWTH
Culture: NO GROWTH
Special Requests: ADEQUATE
Special Requests: ADEQUATE

## 2021-11-01 ENCOUNTER — Other Ambulatory Visit (HOSPITAL_COMMUNITY): Payer: Medicare Other | Admitting: Internal Medicine

## 2021-11-01 DIAGNOSIS — J9621 Acute and chronic respiratory failure with hypoxia: Secondary | ICD-10-CM

## 2021-11-01 DIAGNOSIS — J189 Pneumonia, unspecified organism: Secondary | ICD-10-CM

## 2021-11-01 DIAGNOSIS — A310 Pulmonary mycobacterial infection: Secondary | ICD-10-CM | POA: Diagnosis not present

## 2021-11-01 DIAGNOSIS — J449 Chronic obstructive pulmonary disease, unspecified: Secondary | ICD-10-CM | POA: Diagnosis not present

## 2021-11-02 NOTE — Progress Notes (Signed)
[facility]  PROGRESS NOTE  PULMONARY SERVICE ROUNDS   Theresa Villa  DOB: 07-19-1956  Referring physician: Larena Glassman, MD  HPI: Theresa Villa is a 65 y.o. female  being seen for Acute on Chronic Respiratory Failure.  Patient's husband and son were present in the room they had many questions regarding the patient's possible pleural effusion along with some other concerns that were raised regarding the possibility of endobronchial obstruction.  We had a very lengthy discussion discussed the possible things that could be done including limited to airway evaluation and also patient already has had a thoracentesis done which was a transudative effusion and only 600 cc were removed.  Also I did review the CT scan of the chest done which showed a clear air-fluid level and the report sounds suggestive of fluid inside the large bleb in the left lung.  There is also some other areas which may represent small collection of fluid but certainly not enough to warrant chest tube placement.  The husband had a great deal of concern about performing invasive procedures on his wife and the son also stated that they would prefer to take her home for what ever time that she has left to survive.  I explained to them that as long as it is planned appropriately and it is safe discharge that she could possibly be discharged for home ventilator care and we will talk with the case managers to see what would be required and what needs to be done  Review of Systems: Unremarkable other than noted in HPI  Allergies:  Reviewed on the [facility]  Medications: Reviewed  Vitals: Temperature was 97.1 pulse of 70 respiratory 20 blood pressure was 90/50 saturations 92%  Ventilator Settings: On pressure assist control FiO2 40% tidal volume 429 PEEP 5  Physical Exam: General:  calm and comfortable NAD Eyes: normal lids, irises & conjunctiva ENT: grossly normal tongue not enlarged Neck: no  masses Cardiovascular: S1 S2 Normal no rubs no gallop Respiratory: Diminished breath sounds noted bilaterally Abdomen: soft non-distended Skin: no rash seen on limited exam Musculoskeletal:  no rigidity Psychiatric: unable to assess Neurologic: no involuntary movements          Lab Data and radiological Data:  EXAM:  CT Chest without contrast; with 3D MIPS   INDICATION: Pulmonary nodule follow-up.   TECHNIQUE: Serial axial images from apices to posterior sulci.  3D MIPS  obtained on a separate workstation.  Dose reduction was obtained with  Automatic Exposure Control (AEC) or, if AEC could not be utilized, by  manual adjustment of the mA and/or kV according to patient size.   CONTRAST DOSE: None.   COMPARISON: Chest x-ray 10/27/2021.   FINDINGS:   MEDIASTINUM/GREAT VESSELS:  Tracheostomy tube in place. Heart size is  within normal limits. Trace pericardial fluid/thickening. Shift the  mediastinum towards the left secondary to lower lobe volume loss. Lower  paratracheal node measuring 1.3 cm.   LUNG WINDOWS:  Large 14 cm cystic cavity of the left upper lung prominent.  Smaller bullae/cyst measuring 8.8 cm of the superior segment right lower  lobe with fluid level. Severe emphysematous changes of the visualized  lungs. Groundglass/nodular opacities right lower lobe. There is complete  obstruction/opacification of left lower lobe bronchus with complete  collapse of the left lower lobe. Some internal areas of cavitation are  possible for example image 34 of series 3. Bilateral pleural effusions.   UPPER ABDOMEN: Limited visualization. No obvious free fluid.   BONE  WINDOWS: Chronic thoracic kyphosis. Chronic appearing mid thoracic  compression fractures.   IMPRESSION:   Large left upper lobe cavitary lesion with fluid level, suspicious for  infected cyst/bullae.   Smaller cysts/bullae of the right lung with fluid levels.   Complete endobronchial obstruction of left  lower lobe bronchus with left  lower lobe consolidation/collapse. Within the collapsed left lower lobe,  there are areas of decreased attenuation and cavitation, suspicious for  cavitary/necrotic pneumonia. The endobronchial obstruction could be due to  secretions or mass.   Severe pulmonary emphysema. Nodularity in the right lower lobe consistent  with endobronchial infection.   Tracheostomy tube in place.    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  As I had previously discussed with the family I do not think that complete liberation from the ventilator is going to be possible or even feasible because of her severe underlying emphysema.  I think that if we can get her off for short periods of time that would be a great accomplishment.  In addition as far as doing bronchoscopy or biopsy it is an extremely high risk procedure and after thorough discussion with the patient's family they would not be in favor of any type of intervention even if she is found to have a malignancy.  They do appear to understand that her time is limited and they would prefer actually to take her home on mechanical ventilation for what ever period of time she has left to live.  They are going to discuss this as a family with her and the other son along with husband Healthcare associated pneumonia has been treated  patient does have some air-fluid levels which could be concerning for an infected pulmonary cyst we will discuss with the primary care team and also consider infectious disease evaluation Severe COPD poor prognosis we will continue to follow along MAI patient had been diagnosed quite some time ago and I think part of what we are seeing may be progression of her disease    I have personally evaluated the patient, evaluated the laboratory and imaging results and formulated the assessment and plan and placed orders as needed. The Patient requires high complexity decision making with multiple system involvement. Rounds were done with the Respiratory Therapy Director and respiratory therapist involved in the care of the patient as well as nursing staff.   Yevonne Pax, MD [facility] Pulmonary Critical Care Medicine   This note is for inpatient care

## 2021-11-03 ENCOUNTER — Other Ambulatory Visit: Payer: Self-pay | Admitting: Internal Medicine

## 2021-11-03 NOTE — Progress Notes (Signed)
[facility]  PROGRESS NOTE  PULMONARY SERVICE ROUNDS   Theresa Villa  DOB: [birth year]  Referring physician: Larena Glassman, MD  HPI: Theresa Villa is a 65 y.o. female  being seen for Acute on Chronic Respiratory Failure.  She is on pressure support this morning supposed to be doing T collar  Review of Systems: Unremarkable other than noted in HPI  Allergies:  Reviewed on the [facility]  Medications: Reviewed  Vitals: Temperature 97.4 pulse 70 respiratory 22 blood pressure is 120/60 saturations 96%  Ventilator Settings: On pressure support FiO2 40% pressure 14/5  Physical Exam: General:  calm and comfortable NAD Eyes: normal lids, irises & conjunctiva ENT: grossly normal tongue not enlarged Neck: no masses Cardiovascular: S1 S2 Normal no rubs no gallop Respiratory: Scattered rhonchi expansion is equal Abdomen: soft non-distended Skin: no rash seen on limited exam Musculoskeletal:  no rigidity Psychiatric: unable to assess Neurologic: no involuntary movements          Lab Data and radiological Data:  Labs have been reviewed   Assessment/Plan  Patient Active Problem List   Diagnosis Date Noted   Acute on chronic respiratory failure with hypoxia (HCC) 09/22/2021   Healthcare-associated pneumonia 09/22/2021   MAI (mycobacterium avium-intracellulare) infection (HCC) 09/22/2021   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  Spoke with speech therapy during multidisciplinary rounds we will have them assess for swallowing MAI patient has baseline severe destructive lung disease Severe COPD medical management nebulizers as needed Healthcare associated pneumonia has been treated with antibiotics Cavitated left lung supportive care  prognosis guarded   I have personally evaluated the patient, evaluated the laboratory and imaging results and formulated the assessment and plan and placed orders as needed. The Patient requires high complexity decision making with multiple system involvement. Rounds were done with the Respiratory Therapy Director and respiratory therapist involved in the care of the patient as well as nursing staff.   Yevonne Pax, MD [facility] Pulmonary Critical Care Medicine   This note is for inpatient care

## 2021-11-08 ENCOUNTER — Other Ambulatory Visit (HOSPITAL_COMMUNITY): Payer: Medicare Other | Admitting: Internal Medicine

## 2021-11-08 DIAGNOSIS — J984 Other disorders of lung: Secondary | ICD-10-CM | POA: Diagnosis not present

## 2021-11-08 DIAGNOSIS — J189 Pneumonia, unspecified organism: Secondary | ICD-10-CM | POA: Diagnosis not present

## 2021-11-08 DIAGNOSIS — J9621 Acute and chronic respiratory failure with hypoxia: Secondary | ICD-10-CM | POA: Diagnosis not present

## 2021-11-08 DIAGNOSIS — J449 Chronic obstructive pulmonary disease, unspecified: Secondary | ICD-10-CM

## 2021-11-08 DIAGNOSIS — A310 Pulmonary mycobacterial infection: Secondary | ICD-10-CM

## 2021-11-08 NOTE — Progress Notes (Signed)
[facility]  PROGRESS NOTE  PULMONARY SERVICE ROUNDS   Theresa Villa  DOB: 1956-03-22  Referring physician: Larena Glassman, MD  HPI: Theresa Villa is a 65 y.o. female  being seen for Acute on Chronic Respiratory Failure.  She is on the ventilator and full support has not been tolerating weaning and shows therefore on pressure control mode now is on 50% FiO2.  Husband present in the room he was updated as well as the patient  Review of Systems: Unremarkable other than noted in HPI  Allergies:  Reviewed on the [facility]  Medications: Reviewed  Vitals: Temperature 96.5 pulse 81 respiratory 27 blood pressure is 102/57 saturations 96%  Ventilator Settings: On pressure assist control FiO2 is 50% tidal volume is 347 PEEP 5 IP 15  Physical Exam: General:  calm and comfortable NAD Eyes: normal lids, irises & conjunctiva ENT: grossly normal tongue not enlarged Neck: no masses Cardiovascular: S1 S2 Normal no rubs no gallop Respiratory: Scattered rhonchi expansion is equal Abdomen: soft non-distended Skin: no rash seen on limited exam Musculoskeletal:  no rigidity Psychiatric: unable to assess Neurologic: no involuntary movements          Lab Data and radiological Data:  Labs have been reviewed   Assessment/Plan  Patient Active Problem List   Diagnosis Date Noted   Acute on chronic respiratory failure with hypoxia (HCC) 09/22/2021   Healthcare-associated pneumonia 09/22/2021   MAI (mycobacterium avium-intracellulare) infection (HCC) 09/22/2021   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 MAI patient has severe disease we will continue with supportive care remains at risk for further infection Severe COPD medical management we will continue to monitor closely. Cavitary left lung secondary to above we will continue to follow along   I have personally evaluated the patient, evaluated the laboratory and imaging results and formulated the assessment and plan and placed orders as needed. The Patient requires high complexity decision making with multiple system involvement. Rounds were done with the Respiratory Therapy Director and respiratory therapist involved in the care of the patient as well as nursing staff.   Yevonne Pax, MD [facility] Pulmonary Critical Care Medicine   This note is for inpatient care

## 2021-11-09 ENCOUNTER — Other Ambulatory Visit (HOSPITAL_COMMUNITY): Payer: Medicare Other | Admitting: Internal Medicine

## 2021-11-09 DIAGNOSIS — A310 Pulmonary mycobacterial infection: Secondary | ICD-10-CM

## 2021-11-09 DIAGNOSIS — J9621 Acute and chronic respiratory failure with hypoxia: Secondary | ICD-10-CM

## 2021-11-09 DIAGNOSIS — J449 Chronic obstructive pulmonary disease, unspecified: Secondary | ICD-10-CM

## 2021-11-09 DIAGNOSIS — J189 Pneumonia, unspecified organism: Secondary | ICD-10-CM

## 2021-11-09 DIAGNOSIS — J984 Other disorders of lung: Secondary | ICD-10-CM

## 2021-11-09 NOTE — Progress Notes (Signed)
[facility]  PROGRESS NOTE  PULMONARY SERVICE ROUNDS   Theresa Villa  DOB: 1956/09/22  Referring physician: Larena Glassman, MD  HPI: Theresa Villa is a 65 y.o. female  being seen for Acute on Chronic Respiratory Failure.  Patient is resting comfortably this morning started on spontaneous breathing trial with pressure support she is supposed to do T collar for about 4 hours  Review of Systems: Unremarkable other than noted in HPI  Allergies:  Reviewed on the [facility]  Medications: Reviewed  Vitals: Temperature is 94.0 pulse 75 respiratory is 15 blood pressure is 105/50 saturations 96%  Ventilator Settings: On T collar goal of 4 hours  Physical Exam: General:  calm and comfortable NAD Eyes: normal lids, irises & conjunctiva ENT: grossly normal tongue not enlarged Neck: no masses Cardiovascular: S1 S2 Normal no rubs no gallop Respiratory: No rhonchi very coarse breath sounds Abdomen: soft non-distended Skin: no rash seen on limited exam Musculoskeletal:  no rigidity Psychiatric: unable to assess Neurologic: no involuntary movements          Lab Data and radiological Data:  Labs have been reviewed   Assessment/Plan  Patient Active Problem List   Diagnosis Date Noted   Acute on chronic respiratory failure with hypoxia (HCC) 09/22/2021   Healthcare-associated pneumonia 09/22/2021   MAI (mycobacterium avium-intracellulare) infection (HCC) 09/22/2021   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  Once again had a rather lengthy discussion with the patient's husband I think ultimately her goal is going to be for skilled nursing facility they would still like to continue with the weaning which is fine  but I think we have to look at what is going to be the safest option as far as discharge is concerned Healthcare associated pneumonia has been treated with slow improvement we will continue to monitor along closely. Severe COPD medical management prognosis remains guarded at this time MAI supportive care prognosis is guarded Cavitary lung on the left side portends poor prognosis overall   I have personally evaluated the patient, evaluated the laboratory and imaging results and formulated the assessment and plan and placed orders as needed. The Patient requires high complexity decision making with multiple system involvement. Rounds were done with the Respiratory Therapy Director and respiratory therapist involved in the care of the patient as well as nursing staff.   Yevonne Pax, MD [facility] Pulmonary Critical Care Medicine   This note is for inpatient care

## 2021-11-10 ENCOUNTER — Other Ambulatory Visit (HOSPITAL_COMMUNITY): Payer: Medicare Other | Admitting: Internal Medicine

## 2021-11-10 DIAGNOSIS — J189 Pneumonia, unspecified organism: Secondary | ICD-10-CM

## 2021-11-10 DIAGNOSIS — J449 Chronic obstructive pulmonary disease, unspecified: Secondary | ICD-10-CM | POA: Diagnosis not present

## 2021-11-10 DIAGNOSIS — J9621 Acute and chronic respiratory failure with hypoxia: Secondary | ICD-10-CM | POA: Diagnosis not present

## 2021-11-10 DIAGNOSIS — A310 Pulmonary mycobacterial infection: Secondary | ICD-10-CM

## 2021-11-10 DIAGNOSIS — J984 Other disorders of lung: Secondary | ICD-10-CM

## 2021-11-10 NOTE — Progress Notes (Signed)
[facility]  PROGRESS NOTE  PULMONARY SERVICE ROUNDS   Theresa Villa  DOB: [birth year]  Referring physician: Larena Glassman, MD  HPI: Theresa Villa is a 65 y.o. female  being seen for Acute on Chronic Respiratory Failure.  Patient is comfortable with this time she was actually nonverbal and less responsive but appeared to be comfortable.  I asked for respiratory therapy to get a blood gas on her and the PCO2 was over 100.  Apparently overnight the patient had PEEP was increased to 8 and I did ask for RT to decrease the PEEP further  Review of Systems: Unremarkable other than noted in HPI  Allergies:  Reviewed on the [facility]  Medications: Reviewed  Vitals: 97.8 pulse 86 respiratory 17 blood pressure 86/46 saturations 95%.  Ventilator Settings: Currently is on the ventilator on pressure assist control FiO2 60% tidal volumes to 90 IP 18 PEEP 8  Physical Exam: General:  calm and comfortable NAD Eyes: normal lids, irises & conjunctiva ENT: grossly normal tongue not enlarged Neck: no masses Cardiovascular: S1 S2 Normal no rubs no gallop Respiratory: Scattered rhonchi diminished bilaterally Abdomen: soft non-distended Skin: no rash seen on limited exam Musculoskeletal:  no rigidity Psychiatric: unable to assess Neurologic: no involuntary movements          Lab Data and radiological Data:  Exam: AP Portable chest   History:  Other ( add clinical information to comment box below)   65 year old with a clinical history of dyspnea.   COMPARISON: October 27, 2021 chest x-ray and CT chest dated October 29, 2021   Findings: Again, there is a leftward shift of mediastinal structures  presumably on the basis of LLL volume loss. There is a large air-filled  cavity in the left upper lung. The right lung is hyperexpanded with  interstitial coarsening and large cysts/bullae in the mid and upper lung.  There is slight increase in an opacity along the mid left lung  when  compared with the December 14 exam which could reflect further volume loss  in the left lung.   Impression:  The new opacity in the left lung might reflect further volume loss when  compared with the December 14 exam.   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 Healthcare associated pneumonia.  Chest x-ray shows worsening of lower lobe infiltrates we will start back on antibiotics discussed with the primary care team Severe COPD medical management nebulizers as needed Cavitating left lung supportive care worsening pneumonia also noted explains worsening ventilation to Anxiety supportive care we will monitor   I have personally evaluated the patient, evaluated the laboratory and imaging results and formulated the assessment and plan and placed orders as needed. The Patient requires high complexity decision making with multiple system involvement. Rounds were done  with the Respiratory Therapy Director and respiratory therapist involved in the care of the patient as well as nursing staff.   Yevonne Pax, MD [facility] Pulmonary Critical Care Medicine   This note is for inpatient care

## 2021-11-11 ENCOUNTER — Other Ambulatory Visit (HOSPITAL_COMMUNITY): Payer: Medicare Other | Admitting: Internal Medicine

## 2021-11-11 DIAGNOSIS — J189 Pneumonia, unspecified organism: Secondary | ICD-10-CM | POA: Diagnosis not present

## 2021-11-11 NOTE — Progress Notes (Signed)
Date: 11/11/2021,  MRN# [id number]    Procedure Note: Fiberoptic Bronchoscopy   PROCEDURE DATE: 11/11/2021     NAME:  Theresa Villa   DOB:1956-06-10   MRN: [id number] LOC:  @RB @      Indications/Preliminary Diagnosis: Worsening pneumonia  Consent: (Place X beside choice/s below)  The benefits, risks and possible complications of the procedure were        explained to:  ___ patient  __X_ patient's family  ___ other:___________  who verbalized understanding and gave:  ___ verbal  ___ written  ___ verbal and written  ___ telephone  ___ other:________ consent.      Unable to obtain consent; procedure performed on emergent basis.     Other:      PRESEDATION ASSESSMENT: History and Physical has been performed. Patient meds and allergies have been reviewed. Presedation airway examination has been performed and documented. Baseline vital signs, sedation score, oxygenation status, and cardiac rhythm were reviewed. Patient was deemed to be in satisfactory condition to undergo the procedure.  PREMEDICATIONS:   Sedative/Narcotic Amt Dose   Versed 0 mg   Fentanyl 0 mcg  Diprivan 0 mg         Insertion Route (Place X beside choice below)   Nasal   Oral   Endotracheal Tube  X Tracheostomy   INTRAPROCEDURE MEDICATIONS:  Sedative/Narcotic Amt Dose   Versed 0 mg   Fentanyl 0 mcg  Diprivan 0 mg       Medication Amt Dose  Medication Amt Dose  Xylocaine 2%  cc  Epinephrine 1:10,000 sol  cc  Xylocaine 4%  cc  Cocaine  cc   TECHNICAL PROCEDURES: (Place X beside choice below)   Procedures  Description  X  None     Electrocautery     Cryotherapy     Balloon Dilatation     Bronchography     Stent Placement     Therapeutic Aspiration     Laser/Argon Plasma            SPECIMENS (Sites): (Place X beside choice below)  Specimens Description   No Specimens Obtained     Washings   X Lavage LLL   Biopsies    Fine Needle Aspirates    Brushings    Sputum    FINDINGS: The  procedure was explained to the patient as well as the husband.  After reassurances were made and after the satisfaction of the husband the patient was prepared in the usual manner.  We opted not to give sedation in this case because of high sensitivity to sedation and also tenuous blood pressure.  The patient had the fiberoptic scope inserted through the tracheostomy and down to the carina.  On immediate evaluation copious amounts of secretions were noted to be seen coming out from the left lung.  The area was suctioned out and the airways were followed down to the [place] segments.  Bronchoalveolar lavage was performed from the left lower lobe.  Excellent return was noted.  Patient tolerated the procedure fairly well.  Should be noted that there was some transient desaturations down to about 85% patient however recovered without any major intervention other than bagging and oxygenation.  The specimens that were collected will be sent for gram stain culture sensitivities.  In addition we will send the specimens for fungal cultures viral cultures and AFB specifically MAI.  ESTIMATED BLOOD LOSS: none  COMPLICATIONS/RESOLUTION: none  PROCEDURE DETAILS: Timeout performed and correct patient, name, & ID confirmed. Following prep  per Pulmonary policy, appropriate sedation was administered.  Airway exam proceeded with findings, technical procedures, and specimen collection as noted below. At the end of exam the scope was withdrawn without incident. Impression and Plan as noted below.     IMPRESSION:POST-PROCEDURE DX: Severe pneumonia likely related to history of MAI although bacterial pneumonia will be ruled out also in addition to viral and fungal as noted above   RECOMMENDATION/PLAN: Await results of cultures  I have personally performed the procedure as noted above     Yevonne Pax, MD [facility] Pulmonary Critical Care Medicine

## 2021-11-12 ENCOUNTER — Other Ambulatory Visit (HOSPITAL_COMMUNITY): Payer: Medicare Other | Admitting: Internal Medicine

## 2021-11-12 DIAGNOSIS — J984 Other disorders of lung: Secondary | ICD-10-CM | POA: Diagnosis not present

## 2021-11-12 DIAGNOSIS — J9621 Acute and chronic respiratory failure with hypoxia: Secondary | ICD-10-CM

## 2021-11-12 DIAGNOSIS — J189 Pneumonia, unspecified organism: Secondary | ICD-10-CM | POA: Diagnosis not present

## 2021-11-12 DIAGNOSIS — J449 Chronic obstructive pulmonary disease, unspecified: Secondary | ICD-10-CM

## 2021-11-12 DIAGNOSIS — A310 Pulmonary mycobacterial infection: Secondary | ICD-10-CM

## 2021-11-12 NOTE — Progress Notes (Signed)
[facility]  PROGRESS NOTE  PULMONARY SERVICE ROUNDS   Theresa Villa  DOB: Feb 25, 1956  Referring physician: Larena Glassman, MD  HPI: Theresa Villa is a 65 y.o. female  being seen for Acute on Chronic Respiratory Failure.  Had a very lengthy conversation with the patient as well as the patient's husband in the room with the rounding team.  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  The patient has multidrug resistance MAI and spoke to him about the treatment modalities and also the potential side effects which could be very severe with her.  Also offered for infectious diseases to come and see the patient so that they can discuss it in more detail with them and see if there are any treatment options that are available.  Obviously with her underlying destructive lung disease the prognosis is quite poor  Review of Systems: Unremarkable other than noted in HPI  Allergies:  Reviewed on the [facility]  Medications: Reviewed  Vitals: Temperature 97.8 pulse 100 respiratory 21 blood pressure 110/58 saturations 96%  Ventilator Settings: On the ventilator assist control FiO2 50% tidal volume 490 PEEP 5  Physical Exam: General:  calm and comfortable NAD Eyes: normal lids, irises & conjunctiva ENT: grossly normal tongue not enlarged Neck: no masses Cardiovascular: S1 S2 Normal no rubs no gallop Respiratory: No rhonchi no rales are noted at this time Abdomen: soft non-distended Skin: no rash seen on limited exam Musculoskeletal:  no rigidity Psychiatric: unable to assess Neurologic: no involuntary movements          Lab Data and radiological Data:  Labs have been reviewed   Assessment/Plan  Patient Active Problem List   Diagnosis Date Noted   Acute on chronic respiratory failure with hypoxia (HCC) 09/22/2021   Healthcare-associated pneumonia 09/22/2021   MAI (mycobacterium  avium-intracellulare) infection (HCC) 09/22/2021   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 Healthcare associated pneumonia has been treated we will continue to follow along closely. MAI severe destructive infection as has been previously noted infectious disease consultation has been requested for their input as far as what treatment offers Severe COPD medical management nebulizers as needed she has end-stage disease Cavitating left lung secondary to the severe MAI disease   I have personally evaluated the patient, evaluated the laboratory and imaging results and formulated the assessment and plan and placed orders as needed. The Patient requires high complexity decision making with multiple system involvement. Rounds were done with the Respiratory Therapy Director and respiratory therapist involved in the care of the patient as well as nursing staff.   Yevonne Pax, MD [facility] Pulmonary Critical Care Medicine   This note is for inpatient care

## 2021-11-15 ENCOUNTER — Other Ambulatory Visit (HOSPITAL_COMMUNITY): Payer: Medicare Other | Admitting: Internal Medicine

## 2021-11-15 DIAGNOSIS — J984 Other disorders of lung: Secondary | ICD-10-CM

## 2021-11-15 DIAGNOSIS — J9819 Other pulmonary collapse: Secondary | ICD-10-CM

## 2021-11-15 DIAGNOSIS — A310 Pulmonary mycobacterial infection: Secondary | ICD-10-CM

## 2021-11-15 DIAGNOSIS — J189 Pneumonia, unspecified organism: Secondary | ICD-10-CM

## 2021-11-15 DIAGNOSIS — J449 Chronic obstructive pulmonary disease, unspecified: Secondary | ICD-10-CM

## 2021-11-15 DIAGNOSIS — J9621 Acute and chronic respiratory failure with hypoxia: Secondary | ICD-10-CM | POA: Diagnosis not present

## 2021-11-15 NOTE — Progress Notes (Signed)
[facility]  PROGRESS NOTE  PULMONARY SERVICE ROUNDS   Theresa Villa  DOB: 1956/02/19  Referring physician: Larena Glassman, MD  HPI: Theresa Villa is a 66 y.o. female  being seen for Acute on Chronic Respiratory Failure.  Patient is on pressure control mode has been on 60% oxygen with good saturations.  Had a very lengthy discussion with both sons in the room explained to them that she is having a steady continuous decline in her status and she is basically vent dependent.  The son's understand the situation and I think their main issue is trying to get the father on board with the plan of care.  The father has been waxing and waning as far as what he would like to do going forward.  I explained to the sons regarding the findings of the CT scans the findings of the pleural fluid and the progressive nature of her disease  Review of Systems: Unremarkable other than noted in HPI  Allergies:  Reviewed on the [facility]  Medications: Reviewed  Vitals: Temperature is 98.5 pulse 94 respiratory 27 blood pressure is 121/61 saturations 96%  Ventilator Settings: Pressure assist control FiO2 60% tidal volume 443 PEEP 6  Physical Exam: General:  calm and comfortable NAD Eyes: normal lids, irises & conjunctiva ENT: grossly normal tongue not enlarged Neck: no masses Cardiovascular: S1 S2 Normal no rubs no gallop Respiratory: Scattered rhonchi are noted bilaterally Abdomen: soft non-distended Skin: no rash seen on limited exam Musculoskeletal:  no rigidity Psychiatric: unable to assess Neurologic: no involuntary movements          Lab Data and radiological Data:  Data has been reviewed   Assessment/Plan  Patient Active Problem List   Diagnosis Date Noted   Acute on chronic respiratory failure with hypoxia (HCC) 09/22/2021   Healthcare-associated pneumonia 09/22/2021   MAI (mycobacterium avium-intracellulare) infection (HCC) 09/22/2021   Cavitating mass in left upper  lung lobe 05/12/2020   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  Patient is not able to do any weaning Healthcare associated pneumonia patient is on antibiotics appreciate infectious disease input Mycobacterium AVM infection with severe advanced disease prognosis guarded Severe COPD clinical management we will continue to follow Cavitating left lung as above supportive care   I have personally evaluated the patient, evaluated the laboratory and imaging results and formulated the assessment and plan and placed orders as needed. The Patient requires high complexity decision making with multiple system involvement. Rounds were done with the Respiratory Therapy Director and respiratory therapist involved in the care of the patient as well as nursing staff.   Yevonne Pax, MD [facility] Pulmonary Critical Care Medicine   This note is for inpatient care

## 2021-11-17 ENCOUNTER — Other Ambulatory Visit (HOSPITAL_COMMUNITY): Payer: Medicare Other | Admitting: Internal Medicine

## 2021-11-17 DIAGNOSIS — J9621 Acute and chronic respiratory failure with hypoxia: Secondary | ICD-10-CM | POA: Diagnosis not present

## 2021-11-17 DIAGNOSIS — J189 Pneumonia, unspecified organism: Secondary | ICD-10-CM

## 2021-11-17 DIAGNOSIS — J984 Other disorders of lung: Secondary | ICD-10-CM | POA: Diagnosis not present

## 2021-11-17 DIAGNOSIS — A310 Pulmonary mycobacterial infection: Secondary | ICD-10-CM

## 2021-11-17 DIAGNOSIS — J449 Chronic obstructive pulmonary disease, unspecified: Secondary | ICD-10-CM | POA: Diagnosis not present

## 2021-11-17 NOTE — Progress Notes (Signed)
[facility]  PROGRESS NOTE  PULMONARY SERVICE ROUNDS   Theresa Villa  DOB: 1956-05-23  Referring physician: Larena Glassman, MD  HPI: Theresa Villa is a 66 y.o. female  being seen for Acute on Chronic Respiratory Failure.  Patient this morning was nonverbal she has been on pressors remains on Levophed critically ill  Review of Systems: Unremarkable other than noted in HPI  Allergies:  Reviewed on the [facility]  Medications: Reviewed  Vitals: Temperature is 98.8 pulse 83 respiratory 24 blood pressure 100/50 saturations 98%  Ventilator Settings: On the ventilator and pressure assist control FiO2 60% pressure of 18 PEEP 5  Physical Exam: General:  calm and comfortable NAD Eyes: normal lids, irises & conjunctiva ENT: grossly normal tongue not enlarged Neck: no masses Cardiovascular: S1 S2 Normal no rubs no gallop Respiratory: No rhonchi no rales are noted Abdomen: soft non-distended Skin: no rash seen on limited exam Musculoskeletal:  no rigidity Psychiatric: unable to assess Neurologic: no involuntary movements          Lab Data and radiological Data:  Data reviewed   Assessment/Plan  Patient Active Problem List   Diagnosis Date Noted   Acute on chronic respiratory failure with hypoxia (HCC) 09/22/2021   Healthcare-associated pneumonia 09/22/2021   MAI (mycobacterium avium-intracellulare) infection (HCC) 09/22/2021   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  She has severe MAI infection which is causing her condition to deteriorate.  Not able to be treated infectious disease has seen the patient.  I explained these findings to the patient's family and they do understand that she is terminal. Healthcare associated pneumonia has been treated with  antibiotics continues to have MAI which also was growing in her bronchoscopy culture not a candidate for treatment unfortunately Cavitating left lung supportive care prognosis remains guarded MAI infection as outlined also has been seen by infectious disease Severe COPD medical management prognosis remains guarded   I have personally evaluated the patient, evaluated the laboratory and imaging results and formulated the assessment and plan and placed orders as needed. The Patient requires high complexity decision making with multiple system involvement. Rounds were done with the Respiratory Therapy Director and respiratory therapist involved in the care of the patient as well as nursing staff.   Yevonne Pax, MD [facility] Pulmonary Critical Care Medicine   This note is for inpatient care

## 2021-11-19 ENCOUNTER — Other Ambulatory Visit (HOSPITAL_COMMUNITY): Payer: Medicare Other | Admitting: Internal Medicine

## 2021-11-19 DIAGNOSIS — J9621 Acute and chronic respiratory failure with hypoxia: Secondary | ICD-10-CM | POA: Diagnosis not present

## 2021-11-19 DIAGNOSIS — J984 Other disorders of lung: Secondary | ICD-10-CM | POA: Diagnosis not present

## 2021-11-19 DIAGNOSIS — J449 Chronic obstructive pulmonary disease, unspecified: Secondary | ICD-10-CM | POA: Diagnosis not present

## 2021-11-19 DIAGNOSIS — A310 Pulmonary mycobacterial infection: Secondary | ICD-10-CM

## 2021-11-19 DIAGNOSIS — J189 Pneumonia, unspecified organism: Secondary | ICD-10-CM

## 2021-11-21 NOTE — Progress Notes (Signed)
[facility]  PROGRESS NOTE  PULMONARY SERVICE ROUNDS   Theresa Villa  DOB: 1956/07/22  Referring physician: Larena Glassman, MD  HPI: Theresa Villa is a 66 y.o. female  being seen for Acute on Chronic Respiratory Failure.  Patient is on the ventilator and full support has been on pressure control mode  Review of Systems: Unremarkable other than noted in HPI  Allergies:  Reviewed on the [facility]  Medications: Reviewed  Vitals: Temperature 97.3 pulse 80 respiratory 25 blood pressure is 97/61 saturations 99%  Ventilator Settings: On pressure control FiO2 60% tidal volume 501 PEEP 6  Physical Exam: General:  calm and comfortable NAD Eyes: normal lids, irises & conjunctiva ENT: grossly normal tongue not enlarged Neck: no masses Cardiovascular: S1 S2 Normal no rubs no gallop Respiratory: No rhonchi very coarse breath sounds Abdomen: soft non-distended Skin: no rash seen on limited exam Musculoskeletal:  no rigidity Psychiatric: unable to assess Neurologic: no involuntary movements          Lab Data and radiological Data:  Labs have been reviewed   Assessment/Plan  Patient Active Problem List   Diagnosis Date Noted   Acute on chronic respiratory failure with hypoxia (HCC) 09/22/2021   Healthcare-associated pneumonia 09/22/2021   MAI (mycobacterium avium-intracellulare) infection (HCC) 09/22/2021   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  She needs to continue with full ventilator support have had numerous conversations about her prognosis with the family Healthcare associated pneumonia treated we will continue to follow along closely MAI no change supportive  care Cavitating left lung destructive MAI disease Severe COPD poor prognosis   I have personally evaluated the patient, evaluated the laboratory and imaging results and formulated the assessment and plan and placed orders as needed. The Patient requires high complexity decision making with multiple system involvement. Rounds were done with the Respiratory Therapy Director and respiratory therapist involved in the care of the patient as well as nursing staff.   Yevonne Pax, MD [facility] Pulmonary Critical Care Medicine   This note is for inpatient care

## 2021-11-22 ENCOUNTER — Other Ambulatory Visit (HOSPITAL_COMMUNITY): Payer: Medicare Other | Admitting: Internal Medicine

## 2021-11-22 DIAGNOSIS — J984 Other disorders of lung: Secondary | ICD-10-CM

## 2021-11-22 DIAGNOSIS — J449 Chronic obstructive pulmonary disease, unspecified: Secondary | ICD-10-CM | POA: Diagnosis not present

## 2021-11-22 DIAGNOSIS — J189 Pneumonia, unspecified organism: Secondary | ICD-10-CM

## 2021-11-22 DIAGNOSIS — F17210 Nicotine dependence, cigarettes, uncomplicated: Secondary | ICD-10-CM

## 2021-11-22 DIAGNOSIS — J9621 Acute and chronic respiratory failure with hypoxia: Secondary | ICD-10-CM

## 2021-11-22 NOTE — Progress Notes (Signed)
[facility]  PROGRESS NOTE  PULMONARY SERVICE ROUNDS   Theresa Villa  DOB: 01-May-1956  Referring physician: Larena Glassman, MD  HPI: Theresa Villa is a 66 y.o. female  being seen for Acute on Chronic Respiratory Failure.  Patient is on pressure control mode has been on 60% FiO2 IP is 16 with a PEEP of 6  Review of Systems: Unremarkable other than noted in HPI  Allergies:  Reviewed on the [facility]  Medications: Reviewed  Vitals: Temperature is 97.9 pulse of 90 respiratory 25 blood pressure 91/38 saturations were 94%  Ventilator Settings: Patient remains on the ventilator and full support pressure assist control on FiO2 of 60% IP 18 PEEP 6  Physical Exam: General:  calm and comfortable NAD Eyes: normal lids, irises & conjunctiva ENT: grossly normal tongue not enlarged Neck: no masses Cardiovascular: S1 S2 Normal no rubs no gallop Respiratory: Coarse rhonchi expansion is equal Abdomen: soft non-distended Skin: no rash seen on limited exam Musculoskeletal:  no rigidity Psychiatric: unable to assess Neurologic: no involuntary movements          Lab Data and radiological Data:  Labs have been reviewed   Assessment/Plan  Patient Active Problem List   Diagnosis Date Noted   Acute on chronic respiratory failure with hypoxia (HCC) 09/22/2021   Healthcare-associated pneumonia 09/22/2021   MAI (mycobacterium avium-intracellulare) infection (HCC) 09/22/2021   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 05/12/2020   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   COPD, 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   Cigarette smoker 11/17/2017      Acute on chronic respiratory failure with hypoxia patient is on pressure assist control FiO2 60% IP 18 PEEP 6 patient is now doing well remains leave the vent dependent and also dependent on Levophed with blood pressure dropping anytime that the Levophed is reduced Healthcare associated pneumonia patient has been treated no major changes  noted MAI severe advanced disease Cavitating left lung advanced severe destructive lung disease Severe COPD poor prognosis   I have personally evaluated the patient, evaluated the laboratory and imaging results and formulated the assessment and plan and placed orders as needed.  It is my opinion that the patient is actively dying is hypothermic at this stage family is aware they have been made aware of what is going on unfortunately the husband apparently has a sinus infection is not able to be at the bedside The Patient requires high complexity decision making with multiple system involvement. Rounds were done with the Respiratory Therapy Director and respiratory therapist involved in the care of the patient as well as nursing staff.   Yevonne Pax, MD [facility] Pulmonary Critical Care Medicine   This note is for inpatient care

## 2021-11-24 ENCOUNTER — Other Ambulatory Visit (HOSPITAL_COMMUNITY): Payer: Medicare Other | Admitting: Internal Medicine

## 2021-11-24 DIAGNOSIS — J984 Other disorders of lung: Secondary | ICD-10-CM | POA: Diagnosis not present

## 2021-11-24 DIAGNOSIS — J449 Chronic obstructive pulmonary disease, unspecified: Secondary | ICD-10-CM

## 2021-11-24 DIAGNOSIS — A310 Pulmonary mycobacterial infection: Secondary | ICD-10-CM

## 2021-11-24 DIAGNOSIS — J189 Pneumonia, unspecified organism: Secondary | ICD-10-CM

## 2021-11-24 DIAGNOSIS — J9621 Acute and chronic respiratory failure with hypoxia: Secondary | ICD-10-CM

## 2021-11-24 NOTE — Progress Notes (Signed)
[facility]  PROGRESS NOTE  PULMONARY SERVICE ROUNDS   Theresa Villa  DOB: [birth year]  Referring physician: Larena Glassman, MD  HPI: Theresa Villa is a 66 y.o. female  being seen for Acute on Chronic Respiratory Failure.  Patient is comfortable right now without distress is on pressure control mode has been on 55% FiO2 she continues to require pressors  Review of Systems: Unremarkable other than noted in HPI  Allergies:  Reviewed on the [facility]  Medications: Reviewed  Vitals: Temperature 97.0 pulse 102 respiratory 24 blood pressure is 110/56 saturations 96%  Ventilator Settings: Pressure assist control FiO2 is 55% IP 20 PEEP 6  Physical Exam: General:  calm and comfortable NAD Eyes: normal lids, irises & conjunctiva ENT: grossly normal tongue not enlarged Neck: no masses Cardiovascular: S1 S2 Normal no rubs no gallop Respiratory: No rhonchi very coarse breath sounds Abdomen: soft non-distended Skin: no rash seen on limited exam Musculoskeletal:  no rigidity Psychiatric: unable to assess Neurologic: no involuntary movements          Lab Data and radiological Data:  Data reviewed   Assessment/Plan  Patient Active Problem List   Diagnosis Date Noted   Acute on chronic respiratory failure with hypoxia (HCC) 09/22/2021   Healthcare-associated pneumonia 09/22/2021   MAI (mycobacterium avium-intracellulare) infection (HCC) 09/22/2021   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  Patient's prognosis remains quite guarded Cavitating left lung no change prognosis guarded Severe COPD medical management MAI not able to treat not a candidate Healthcare associated pneumonia has been treated with antibiotics we will continue  to follow along closely   I have personally evaluated the patient, evaluated the laboratory and imaging results and formulated the assessment and plan and placed orders as needed. The Patient requires high complexity decision making with multiple system involvement. Rounds were done with the Respiratory Therapy Director and respiratory therapist involved in the care of the patient as well as nursing staff.   Yevonne Pax, MD [facility] Pulmonary Critical Care Medicine   This note is for inpatient care

## 2021-11-26 ENCOUNTER — Other Ambulatory Visit (HOSPITAL_COMMUNITY): Payer: Medicare Other | Admitting: Internal Medicine

## 2021-11-26 DIAGNOSIS — J984 Other disorders of lung: Secondary | ICD-10-CM | POA: Diagnosis not present

## 2021-11-26 DIAGNOSIS — J449 Chronic obstructive pulmonary disease, unspecified: Secondary | ICD-10-CM | POA: Diagnosis not present

## 2021-11-26 DIAGNOSIS — F17210 Nicotine dependence, cigarettes, uncomplicated: Secondary | ICD-10-CM

## 2021-11-26 DIAGNOSIS — J9621 Acute and chronic respiratory failure with hypoxia: Secondary | ICD-10-CM | POA: Diagnosis not present

## 2021-11-26 DIAGNOSIS — J189 Pneumonia, unspecified organism: Secondary | ICD-10-CM

## 2021-11-26 NOTE — Progress Notes (Signed)
[place]  PROGRESS NOTE  PULMONARY SERVICE ROUNDS   Benny Henrie  DOB: [birth year]  Referring physician: Deanne Coffer, MD  HPI: Theresa Villa is a 66 y.o. female  being seen for Acute on Chronic Respiratory Failure.  Patient is on pressure control mode on 60% FiO2 requiring full support no major improvement is noted  Review of Systems: Unremarkable other than noted in HPI  Allergies:  Reviewed on the [facility]  Medications: Reviewed  Vitals: Temperature 94.9 pulse 90 respiratory 25 blood pressure is 102/57 saturations 98%  Ventilator Settings: On pressure assist control FiO2 60% IP 20 PEEP 5  Physical Exam: General:  calm and comfortable NAD Eyes: normal lids, irises & conjunctiva ENT: grossly normal tongue not enlarged Neck: no masses Cardiovascular: S1 S2 Normal no rubs no gallop Respiratory: Scattered rhonchi very coarse breath sounds Abdomen: soft non-distended Skin: no rash seen on limited exam Musculoskeletal:  no rigidity Psychiatric: unable to assess Neurologic: no involuntary movements          Lab Data and radiological Data:  Complete Blood Count (CBC) Specimen:  Blood  Ref Range & Units 1 d ago  WBC (White Blood Cell Count) 3.2 - 9.8 x109/L 19.0 High    Hemoglobin 12.0 - 15.5 g/dL 8.6 Low    Hematocrit 35.0 - 45.0 % 30.2 Low    Platelets 150 - 450 x109/L 176   MCV (Mean Corpuscular Volume) 80 - 98 fL 86   MCH (Mean Corpuscular Hemoglobin) 26.5 - 34.0 pg 24.5 Low    MCHC (Mean Corpuscular Hemoglobin Concentration) 31.4 - 36.0 % 28.5 Low    RBC (Red Blood Cell Count) 3.77 - 5.16 x1012/L 3.51 Low    RDW-CV (Red Cell Distribution Width) 11.5 - 14.5 % 21.8 High    NRBC (Nucleated Red Blood Cell Count) 0 x109/L 0.02 High    NRBC % (Nucleated Red Blood Cell %) % 0.1   MPV (Mean Platelet Volume) 7.2 - 11.7 fL 11.1   Resulting Agency  [facility]  Specimen Collected: 11/26/21 03:33 Last Resulted: 11/26/21 03:38  Received  From: [facility]  Result Received: 11/26/21 11:54   Comprehensive Metabolic Panel (CMP) Specimen:  Blood  Ref Range & Units 2 d ago Comments  Sodium 135 - 145 mmol/L 143    Potassium 3.5 - 5.0 mmol/L 4.5    Chloride 98 - 108 mmol/L 105    Carbon Dioxide (CO2) 21 - 30 mmol/L 34 High     Urea Nitrogen (BUN) 7 - 20 mg/dL 40 High     Creatinine 0.4 - 1.0 mg/dL <0.3 Low     Glucose 70 - 140 mg/dL 173 High   Interpretive Data:  Above is the NONFASTING reference range.   Below are the FASTING reference ranges:  NORMAL:      70-99 mg/dL  PREDIABETES: 100-125 mg/dL  DIABETES:    > 125 mg/dL   Calcium 8.7 - 10.2 mg/dL 8.1 Low     AST (Aspartate Aminotransferase) 15 - 41 U/L 15    ALT (Alanine Aminotransferase) 14 - 54 U/L 16    Bilirubin, Total 0.4 - 1.5 mg/dL 0.2 Low     Alk Phos (Alkaline Phosphatase) 24 - 110 U/L 123 High     Albumin 3.5 - 4.8 g/dL 1.2 Low     Protein, Total 6.2 - 8.1 g/dL 4.4 Low     Anion Gap 3 - 12 mmol/L 4    BUN/CREA Ratio 6 - 27 191  High     Glomerular Filtration Rate (eGFR)    eGFR not calculated based upon patient's age or missing information.  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  Patient has been on antibiotics for presumed pneumonia really not showing signs of improvement her oxygen requirements are at 60% Right middle lobe syndrome supportive care  patient has history of MAI Cavitating left lung prognosis is poor Severe COPD medical management Healthcare associated pneumonia patient is on antibiotics we will continue to monitor closely   I have personally evaluated the patient, evaluated the laboratory and imaging results and formulated the assessment and plan and placed orders as needed. The Patient requires high complexity decision making with multiple system involvement. Rounds were done with the Respiratory Therapy Director and respiratory therapist involved in the care of the patient as well as nursing staff.   Allyne Gee, MD [facility] Pulmonary Critical Care Medicine   This note is for inpatient care

## 2021-12-15 DEATH — deceased

## 2022-12-07 IMAGING — DX DG CHEST 1V
1 series · 1 of 1 positions shown · non-contrast
Comparison: 06/03/2020.

CLINICAL DATA: NG tube placement, respiratory failure.

EXAM:
CHEST  1 VIEW

[chest ap]
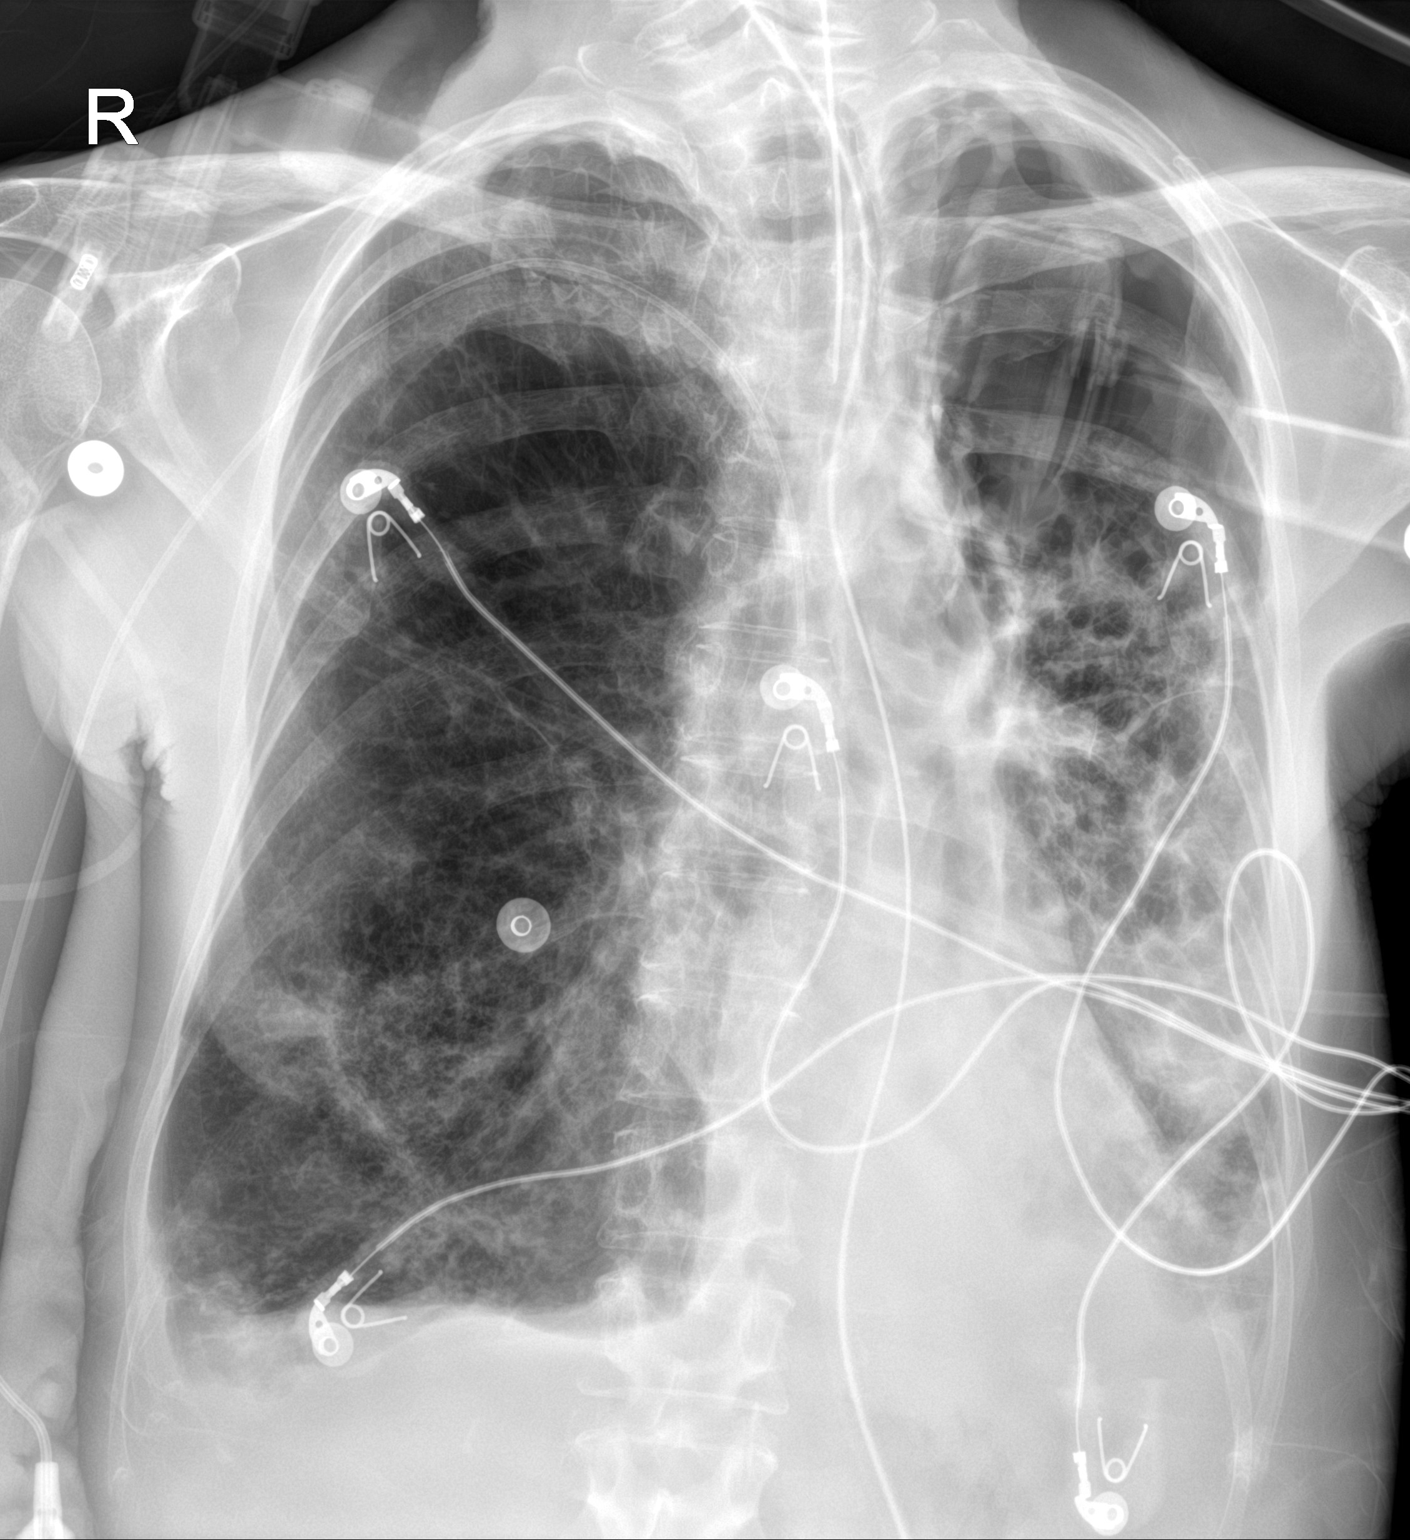

[1 of 1 positions shown; findings below may reference images not displayed]

FINDINGS: The heart is normal in size. Atherosclerotic calcification of the
aorta is noted. Emphysematous changes are noted in the lungs with
bullous emphysema in the left upper lobe, increased lucency as
compared with the prior exam. There is reduced lung volume on the
left with mild mediastinal shift to the left. Apical pleural
thickening is noted bilaterally. There are patchy opacities in the
mid to lower left lung field and at the right lung base. There is
blunting of the costophrenic angles bilaterally. An endotracheal
tube terminates 5.7 cm above the carina. A right PICC line
terminates over the superior vena cava. Enteric tube courses over
the left upper quadrant and out of the field of view.
IMPRESSION: 1. Chronic obstructive pulmonary disease with bullous changes at the
left lung apex. Increased lucency is noted at the left lung apex
with reduced lung volume on the left and mild mediastinal shift to
the left and the possibility of pneumothorax cannot be completely
excluded. Short-term follow-up chest radiograph or CT is
recommended.
2. Airspace and interstitial airspace opacities in the mid to lower
left lung and at the right lung base, possible infiltrate.
3. Small bilateral pleural effusions.

## 2022-12-07 IMAGING — DX DG ABD PORTABLE 1V
1 series · 1 of 1 positions shown · non-contrast
Comparison: 09/21/2021.

CLINICAL DATA: Impaired nasogastric feeding tube.

EXAM:
PORTABLE ABDOMEN - 1 VIEW

[abdomen supine]
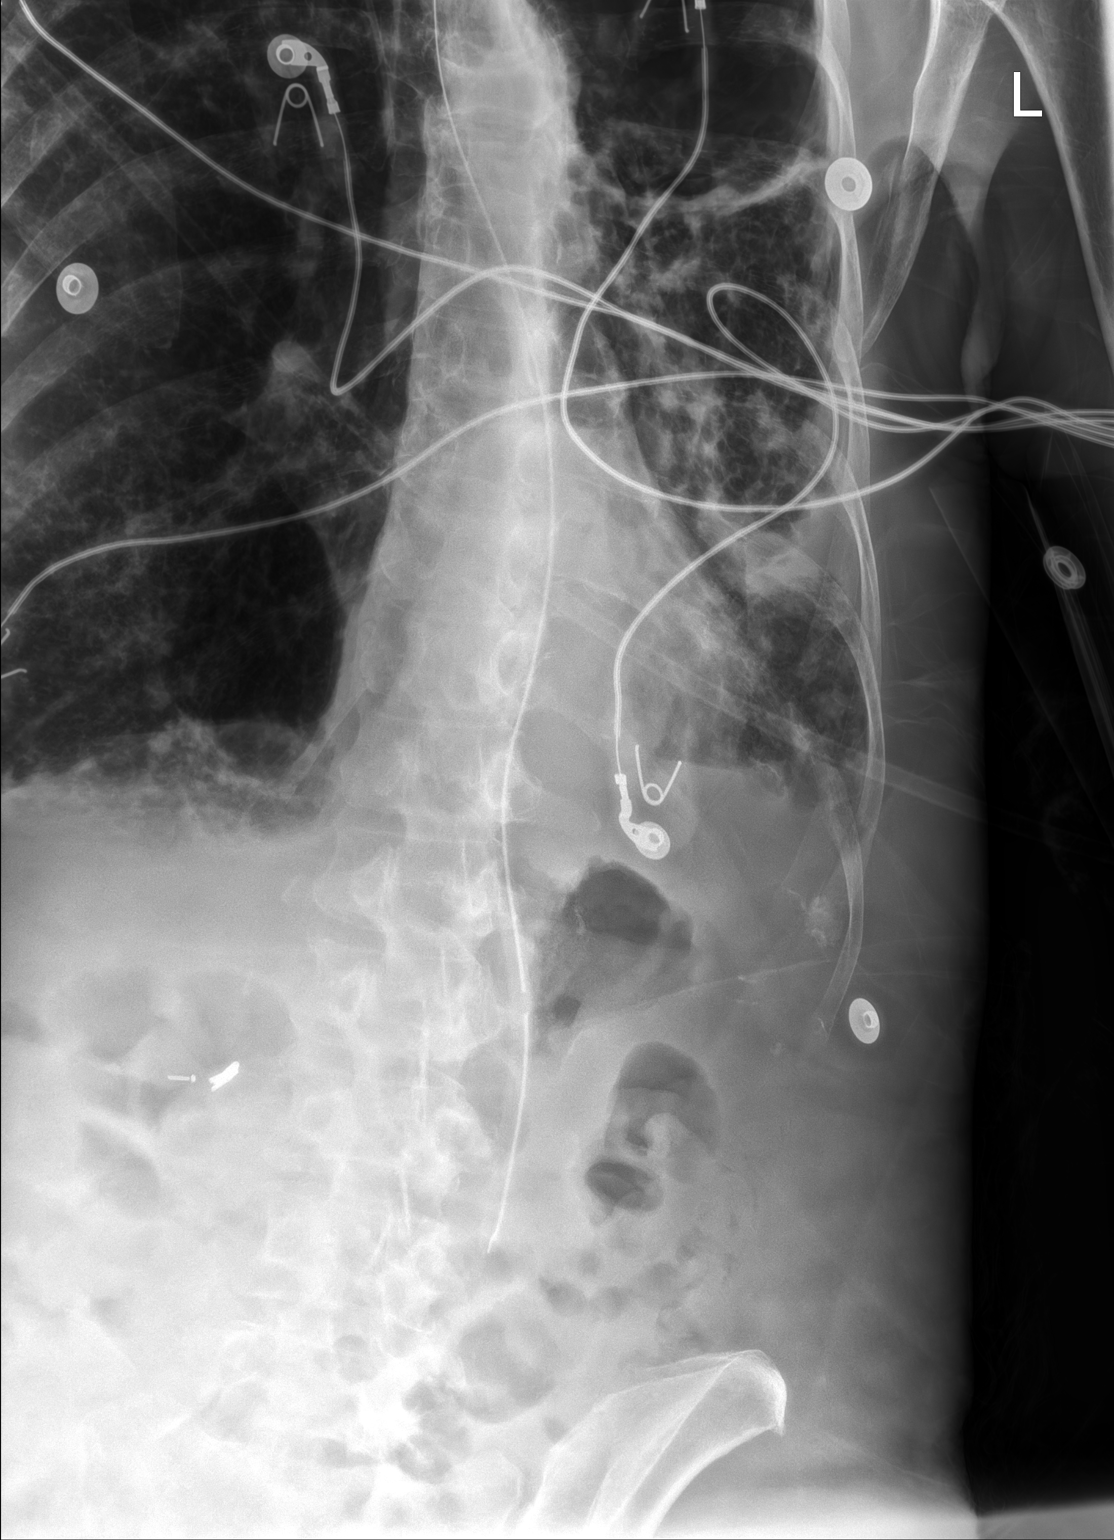

[1 of 1 positions shown; findings below may reference images not displayed]

FINDINGS: The bowel gas pattern is normal. An enteric tube terminates over the
stomach. Surgical clips are present in the right upper quadrant.
Patchy airspace disease is present in the mid to lower left lung
field and right lung base. There is a small left pleural effusion.
No acute osseous abnormality.
IMPRESSION: 1. An enteric tube terminates over the stomach.
2. Remaining findings are unchanged.

## 2022-12-08 IMAGING — CT CT CHEST W/O CM
2 of 5 series · 13 of 36 positions shown, 16 images · non-contrast
Comparison: Chest radiograph dated 09/21/2021. CT chest dated
07/27/2020.

CLINICAL DATA: Possible pneumothorax

EXAM:
CT CHEST WITHOUT CONTRAST
TECHNIQUE: Multidetector CT imaging of the chest was performed following the
standard protocol without IV contrast.

[Series 6: chest w/o 2mm st cor · coronal · non-contrast · 0.58mm/px · 3 of 114 slices shown]
[im 23/114  lung]
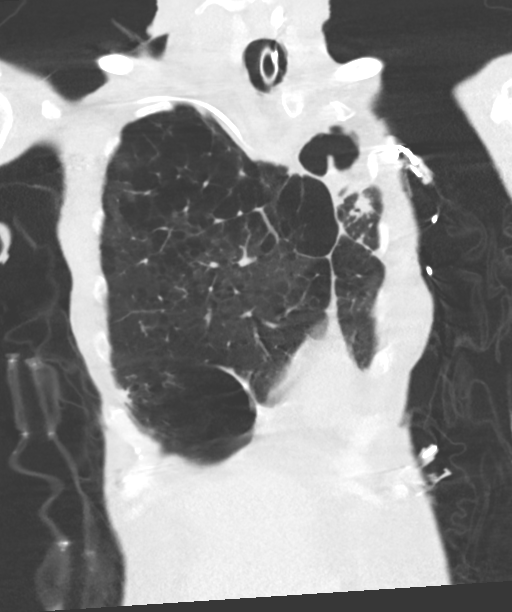
[im 46/114  lung]
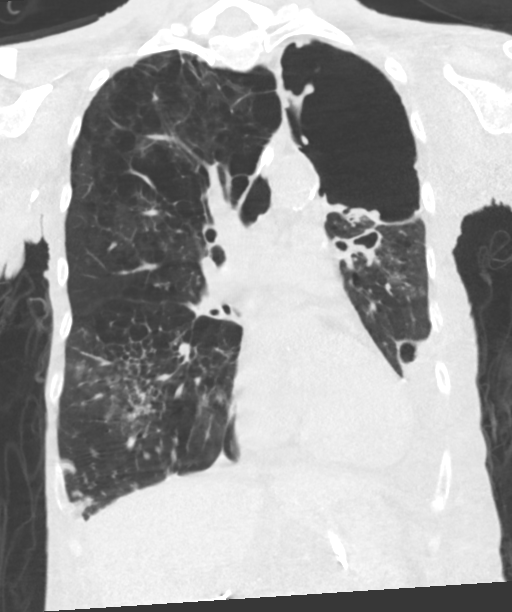
[im 68/114  lung]
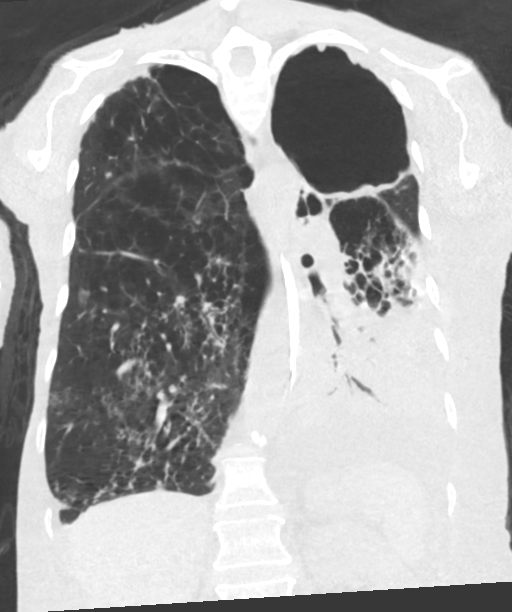

[Series 10: chest w/o 1mm true st · axial · non-contrast · 0.53mm/px · z∈[-1005,-705]mm · 10 of 357 slices shown, 13 images]
[im 28/357  mediastinal]
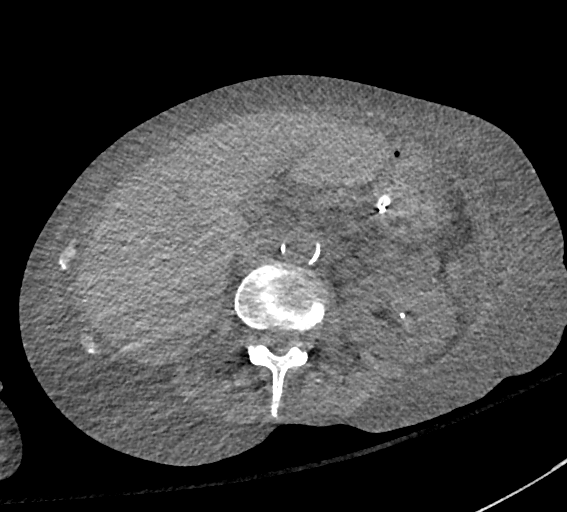
[im 28/357  lung]
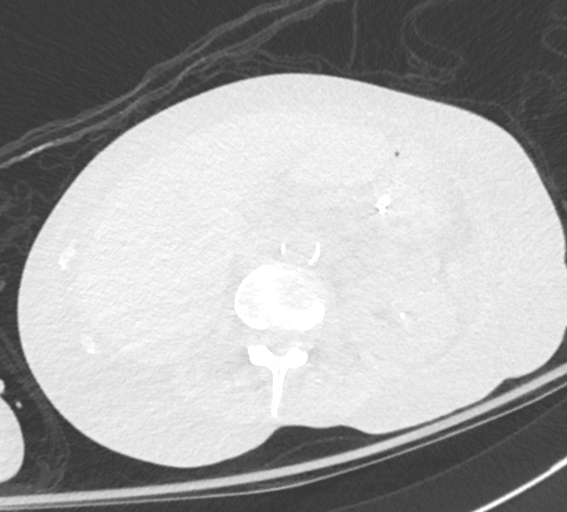
[im 55/357  lung]
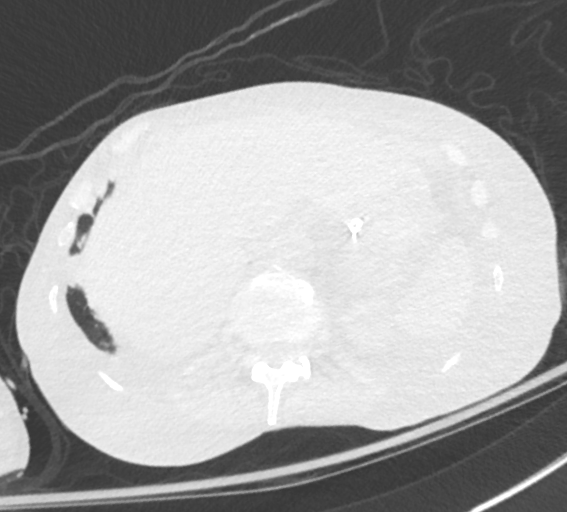
[im 110/357  lung]
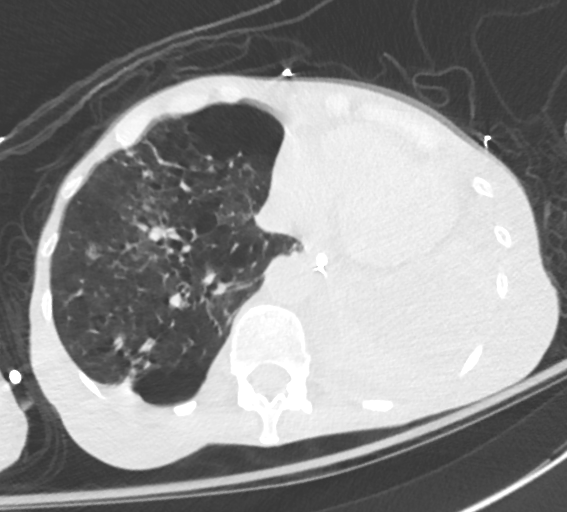
[im 137/357  lung]
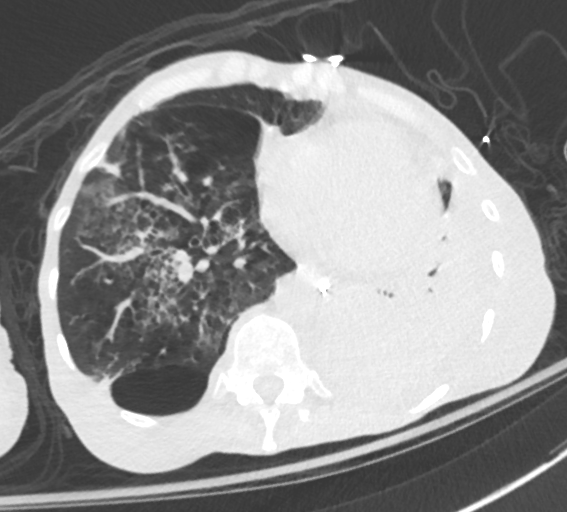
[im 165/357  mediastinal]
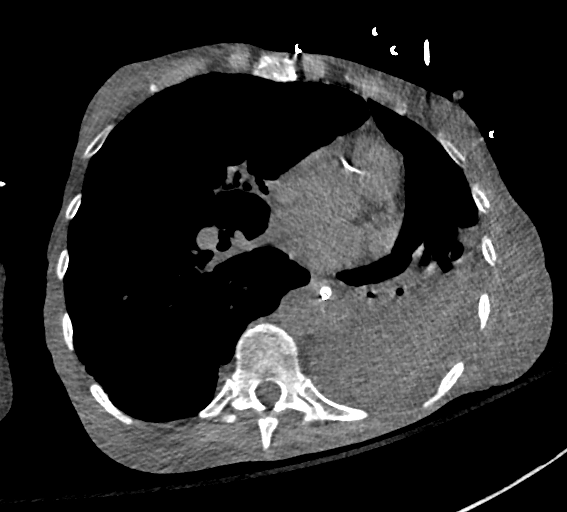
[im 165/357  lung]
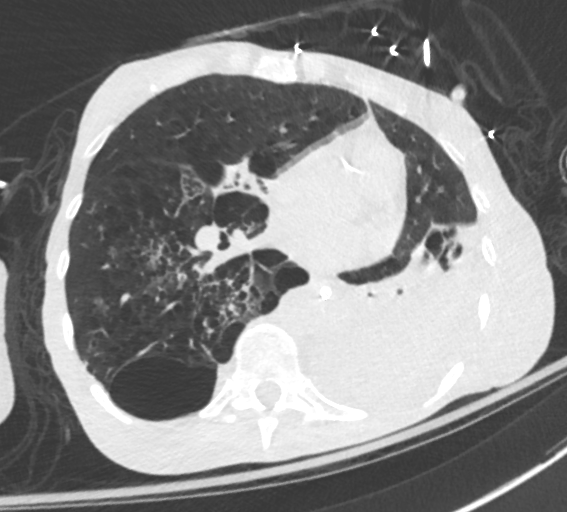
[im 192/357  lung]
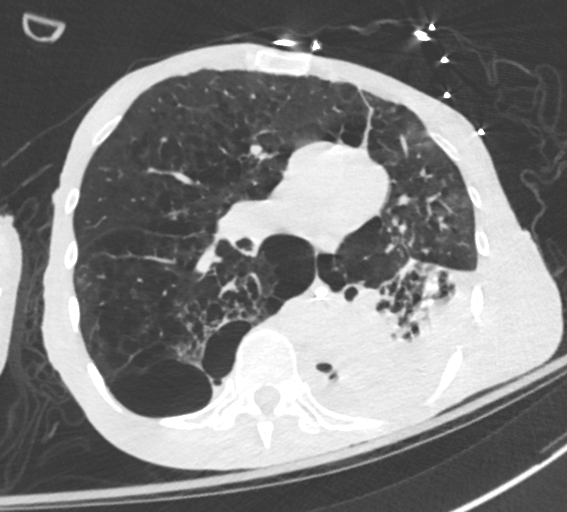
[im 220/357  lung]
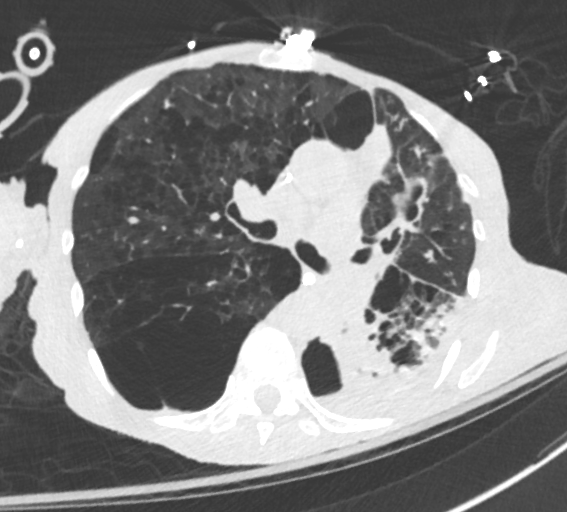
[im 274/357  lung]
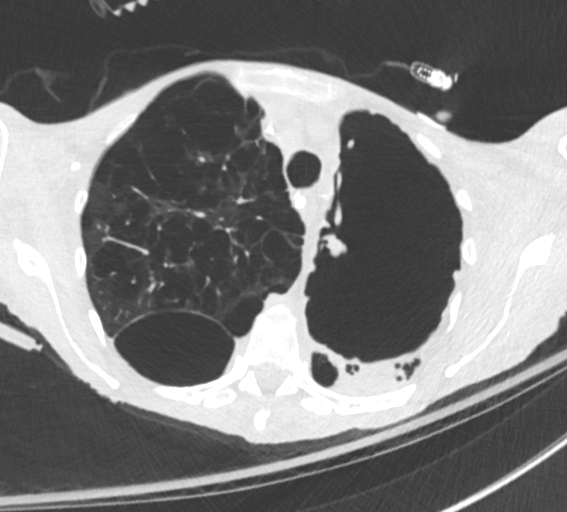
[im 302/357  mediastinal]
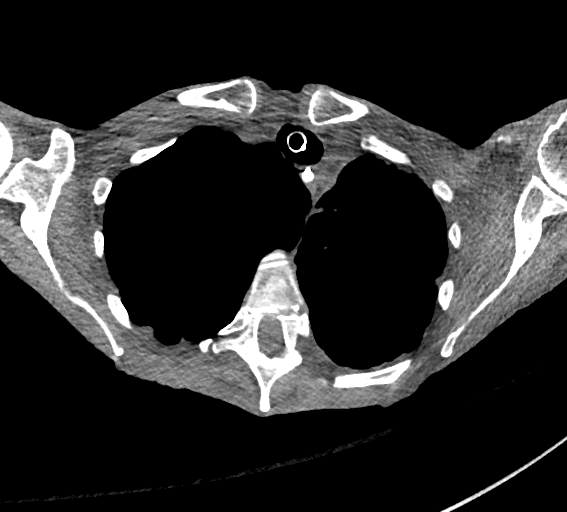
[im 302/357  lung]
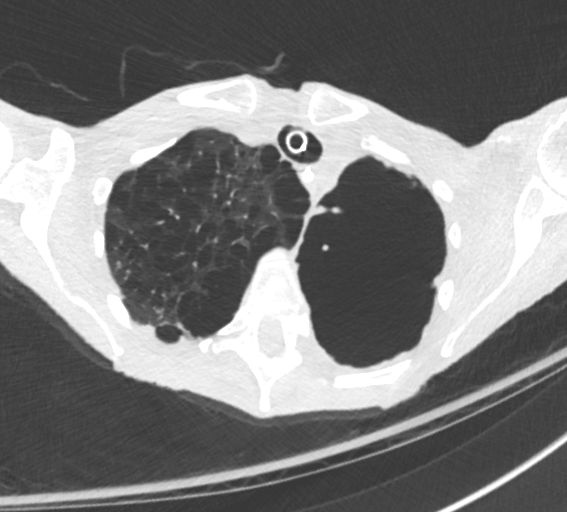
[im 329/357  lung]
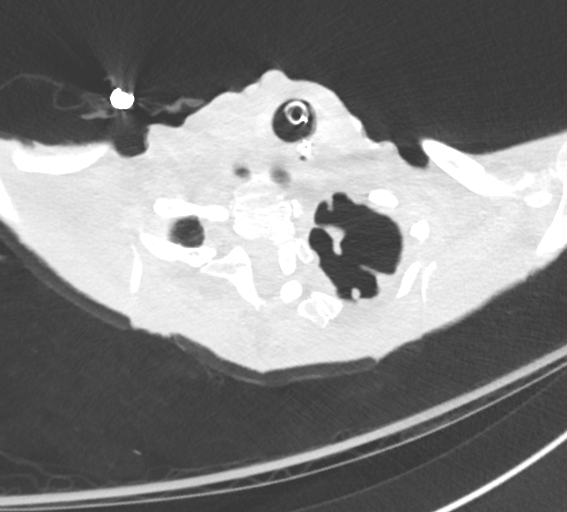

[13 of 36 positions shown; findings below may reference images not displayed]

FINDINGS: Cardiovascular: The heart is normal in size with leftward
cardiomediastinal shift. No pericardial effusion.

No evidence of thoracic aortic aneurysm. Atherosclerotic
calcifications of the aortic arch.

Right arm PICC terminates lower SVC.

Mediastinum/Nodes: Small mediastinal lymph nodes which do not meet
pathologic CT size criteria.

Visualized thyroid is unremarkable.

Lungs/Pleura: Endotracheal tube terminates 4.5 cm above the carina.

13.3 cm thick-walled cavitary lesion in the left upper lobe,
corresponding to the radiographic abnormality, with associated
bronchiectasis. While progressive from priors, and neoplasm is
difficult to exclude, given the additional findings this is favored
to be post infectious/inflammatory.

Left lower lobe pneumonia with moderate left pleural effusion and
superimposed left lower lobe atelectasis.

Mild patchy/ground-glass opacity in the right lower lobe with
clustered nodularity, reflecting mild right lower lobe pneumonia,
with associated small right pleural effusion.

Severe centrilobular and paraseptal emphysematous changes.

No pneumothorax.

Upper Abdomen: Visualized upper abdomen is notable for an enteric
tube coursing the stomach, prior cholecystectomy, and vascular
calcifications.

Musculoskeletal: Moderate compression fracture at T5. Mild
impression fracture at T6. These are new from 8181, but likely
chronic. No retropulsion.
IMPRESSION: 13.3 cm cavitary lesion in the left upper lobe, favored to be post
infectious inflammatory. This corresponds to the radiographic
abnormality. No evidence of pneumothorax.

Multifocal pneumonia in the bilateral lower lobes. Superimposed left
lower lobe atelectasis with volume loss.

Moderate left pleural effusion.  Small right pleural effusion.

Support apparatus as above.

Aortic Atherosclerosis (H37DH-49S.S) and Emphysema ([id number].A).
